# Patient Record
Sex: Male | Born: 1990 | Race: Black or African American | Hispanic: No | Marital: Single | Smoking: Never smoker
Health system: Southern US, Community
[De-identification: ages and names within clinical notes are randomized; demographics above are authoritative.]

## PROBLEM LIST (undated history)

## (undated) ENCOUNTER — Ambulatory Visit (HOSPITAL_COMMUNITY): Discharge status: Absent without leave | Payer: Commercial Managed Care - PPO

## (undated) DIAGNOSIS — E039 Hypothyroidism, unspecified: Secondary | ICD-10-CM

## (undated) DIAGNOSIS — R569 Unspecified convulsions: Secondary | ICD-10-CM

## (undated) DIAGNOSIS — E109 Type 1 diabetes mellitus without complications: Secondary | ICD-10-CM

## (undated) DIAGNOSIS — F840 Autistic disorder: Secondary | ICD-10-CM

## (undated) DIAGNOSIS — E041 Nontoxic single thyroid nodule: Secondary | ICD-10-CM

## (undated) HISTORY — DX: Type 1 diabetes mellitus without complications: E10.9

## (undated) HISTORY — DX: Nontoxic single thyroid nodule: E04.1

## (undated) HISTORY — DX: Hypothyroidism, unspecified: E03.9

## (undated) HISTORY — PX: NO PAST SURGERIES: SHX2092

## (undated) HISTORY — DX: Autistic disorder: F84.0

## (undated) HISTORY — DX: Unspecified convulsions: R56.9

---
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
## 2012-04-01 LAB — THYROID PANEL
TSH: 4.17
Thyroxine (T4): 3.9

## 2013-04-07 ENCOUNTER — Ambulatory Visit: Payer: Self-pay | Admitting: Family Medicine

## 2013-04-07 DIAGNOSIS — Z0289 Encounter for other administrative examinations: Secondary | ICD-10-CM

## 2013-07-27 ENCOUNTER — Ambulatory Visit (INDEPENDENT_AMBULATORY_CARE_PROVIDER_SITE_OTHER): Payer: Commercial Managed Care - PPO | Admitting: Family Medicine

## 2013-07-27 ENCOUNTER — Encounter: Payer: Self-pay | Admitting: Family Medicine

## 2013-07-27 ENCOUNTER — Encounter (INDEPENDENT_AMBULATORY_CARE_PROVIDER_SITE_OTHER): Payer: Self-pay

## 2013-07-27 VITALS — BP 122/78 | HR 64 | Temp 97.8°F | Ht 68.0 in | Wt 160.5 lb

## 2013-07-27 DIAGNOSIS — E109 Type 1 diabetes mellitus without complications: Secondary | ICD-10-CM | POA: Insufficient documentation

## 2013-07-27 DIAGNOSIS — E108 Type 1 diabetes mellitus with unspecified complications: Secondary | ICD-10-CM

## 2013-07-27 DIAGNOSIS — G40909 Epilepsy, unspecified, not intractable, without status epilepticus: Secondary | ICD-10-CM | POA: Insufficient documentation

## 2013-07-27 DIAGNOSIS — F840 Autistic disorder: Secondary | ICD-10-CM

## 2013-07-27 DIAGNOSIS — E039 Hypothyroidism, unspecified: Secondary | ICD-10-CM

## 2013-07-27 NOTE — Assessment & Plan Note (Signed)
Chronic, well controlled on keppra.

## 2013-07-27 NOTE — Assessment & Plan Note (Signed)
Chronic, seems he's followed by [facility] - will await records from them. Consider checking A1c next visit.

## 2013-07-27 NOTE — Progress Notes (Signed)
   BP 122/78  Pulse 64  Temp(Src) 97.8 F (36.6 C) (Oral)  Ht 5\' 8"  (1.727 m)  Wt 160 lb 8 oz (72.802 kg)  BMI 24.41 kg/m2   CC: new pt to establish  Subjective:    Patient ID: Francisco Colon, male    DOB: 10-26-1990, 23 y.o.   MRN: [id number]  HPI: Francisco Colon is a 23 y.o. male presenting on 07/27/2013 for Establish Care   23 yo with autism spectrum, seizure disorder controlled on keppra, hypothyroidism on levothyroxine, and T1DM presents to establish care today. Presents with uncle Cleophus MoltDavid Spruill (guardian)  [facility] - last seen 02/2013. Sees twice a year. Prior saw Dr. Glenis SmokerNeimeyer.  Finished at [employer] [facility] recently. Has seen [facility] for diabetic eye exam,   Seizure disorder - started 2 yrs ago. Controlled on keppra, Seizure free for last 2 yrs Hypothyroidism - on levothyroxine 50mcg daily for last few years. T1DM - dx age 89yo.  On novolog 70/30 - takes 35u in am and 20u at night.  Sees [facility] Q6 mo  Relevant past medical, surgical, family and social history reviewed and updated as indicated.  Allergies and medications reviewed and updated. No current outpatient prescriptions on file prior to visit.   No current facility-administered medications on file prior to visit.    Review of Systems Per HPI unless specifically indicated above    Objective:    BP 122/78  Pulse 64  Temp(Src) 97.8 F (36.6 C) (Oral)  Ht 5\' 8"  (1.727 m)  Wt 160 lb 8 oz (72.802 kg)  BMI 24.41 kg/m2  Physical Exam  Nursing note and vitals reviewed. Constitutional: He appears well-developed and well-nourished. No distress.  Pt agitated today, I was only able to do limited exam  HENT:  Head: Normocephalic and atraumatic.  Right Ear: Hearing, tympanic membrane, external ear and ear canal normal.  Left Ear: Hearing, tympanic membrane, external ear and ear canal normal.  Mouth/Throat: Oropharynx is clear and moist. No oropharyngeal exudate.  Eyes:  Conjunctivae and EOM are normal. Pupils are equal, round, and reactive to light. No scleral icterus.  Cardiovascular: Normal rate, regular rhythm, normal heart sounds and intact distal pulses.   No murmur heard. Pulmonary/Chest: Effort normal and breath sounds normal. No respiratory distress. He has no wheezes. He has no rales.  Psychiatric: His mood appears anxious. He is agitated and aggressive.   No results found for this or any previous visit.    Assessment & Plan:   Problem List Items Addressed This Visit   Type 1 diabetes - Primary     Chronic, seems he's followed by [facility] - will await records from them. Consider checking A1c next visit.    Relevant Medications      insulin aspart protamine- aspart (NOVOLOG MIX 70/30) (70-30) 100 UNIT/ML injection   Seizure disorder     Chronic, well controlled on keppra.    Hypothyroidism     Chronic, stable. Check records when they arrive. Consider TSH next labwork if not recently done.    Relevant Medications      levothyroxine (SYNTHROID, LEVOTHROID) 50 MCG tablet   Autism spectrum disorder     Will request records from prior PCP and [facility]        Follow up plan: Return in about 4 months (around 11/27/2013), or as needed, for physical.

## 2013-07-27 NOTE — Assessment & Plan Note (Signed)
Will request records from prior PCP and [facility]

## 2013-07-27 NOTE — Assessment & Plan Note (Signed)
Chronic, stable. Check records when they arrive. Consider TSH next labwork if not recently done.

## 2013-07-27 NOTE — Patient Instructions (Addendum)
Sign release of records from Dr. Glenis SmokerNeimeyer and [facility]. Bring me a copy of immunizations from health department. Good to meet you today, call us with quesitons. Return to see me in 3-4 months for follow up visit, may check blood work then.

## 2013-07-27 NOTE — Progress Notes (Signed)
Pre visit review using our clinic review tool, if applicable. No additional management support is needed unless otherwise documented below in the visit note. 

## 2013-07-31 ENCOUNTER — Other Ambulatory Visit: Payer: Self-pay

## 2013-07-31 MED ORDER — LEVOTHYROXINE SODIUM 50 MCG PO TABS: 50.0000 ug | ORAL_TABLET | Freq: Every day | ORAL | Status: DC

## 2013-07-31 MED ORDER — LEVETIRACETAM 500 MG PO TABS: 500.0000 mg | ORAL_TABLET | Freq: Two times a day (BID) | ORAL | Status: DC

## 2013-07-31 NOTE — Telephone Encounter (Signed)
Refilled

## 2013-07-31 NOTE — Telephone Encounter (Signed)
Pt left note requesting refill on Keppra and Synthroid to [facility]; is it OK to refill?

## 2013-08-08 ENCOUNTER — Encounter: Payer: Self-pay | Admitting: Family Medicine

## 2013-08-11 ENCOUNTER — Encounter: Payer: Self-pay | Admitting: *Deleted

## 2013-10-25 ENCOUNTER — Encounter: Payer: Self-pay | Admitting: Family Medicine

## 2013-11-16 ENCOUNTER — Encounter: Payer: Self-pay | Admitting: Family Medicine

## 2013-11-16 ENCOUNTER — Ambulatory Visit (INDEPENDENT_AMBULATORY_CARE_PROVIDER_SITE_OTHER): Payer: Commercial Managed Care - PPO | Admitting: Family Medicine

## 2013-11-16 VITALS — BP 118/78 | HR 68 | Temp 97.7°F | Wt 163.2 lb

## 2013-11-16 DIAGNOSIS — F840 Autistic disorder: Secondary | ICD-10-CM

## 2013-11-16 DIAGNOSIS — E039 Hypothyroidism, unspecified: Secondary | ICD-10-CM | POA: Diagnosis not present

## 2013-11-16 DIAGNOSIS — G40909 Epilepsy, unspecified, not intractable, without status epilepticus: Secondary | ICD-10-CM

## 2013-11-16 DIAGNOSIS — E108 Type 1 diabetes mellitus with unspecified complications: Secondary | ICD-10-CM

## 2013-11-16 LAB — COMPREHENSIVE METABOLIC PANEL
ALBUMIN: 3.7 g/dL (ref 3.5–5.2)
ALT: 15 U/L (ref 0–53)
AST: 16 U/L (ref 0–37)
Alkaline Phosphatase: 45 U/L (ref 39–117)
BUN: 12 mg/dL (ref 6–23)
CALCIUM: 9.4 mg/dL (ref 8.4–10.5)
CHLORIDE: 101 meq/L (ref 96–112)
CO2: 37 mEq/L — ABNORMAL HIGH (ref 19–32)
Creatinine, Ser: 1 mg/dL (ref 0.4–1.5)
GFR: 118.04 mL/min (ref >60.00)
Glucose, Bld: 67 mg/dL — ABNORMAL LOW (ref 70–99)
Potassium: 3.5 mEq/L (ref 3.5–5.1)
Sodium: 140 mEq/L (ref 135–145)
Total Bilirubin: 1.8 mg/dL — ABNORMAL HIGH (ref 0.2–1.2)
Total Protein: 7.4 g/dL (ref 6.0–8.3)

## 2013-11-16 LAB — LIPID PANEL
CHOLESTEROL: 130 mg/dL (ref 0–200)
HDL: 50.9 mg/dL (ref >39.00)
LDL Cholesterol: 66 mg/dL (ref 0–99)
NONHDL: 79.1
Total CHOL/HDL Ratio: 3
Triglycerides: 67 mg/dL (ref 0.0–149.0)
VLDL: 13.4 mg/dL (ref 0.0–40.0)

## 2013-11-16 LAB — TSH: TSH: 3.14 u[IU]/mL (ref 0.35–4.50)

## 2013-11-16 LAB — HEMOGLOBIN A1C: Hgb A1c MFr Bld: 8.7 % — ABNORMAL HIGH (ref 4.6–6.5)

## 2013-11-16 MED ORDER — LEVOTHYROXINE SODIUM 50 MCG PO TABS: 50.0000 ug | ORAL_TABLET | Freq: Every day | ORAL | Status: DC

## 2013-11-16 MED ORDER — LEVETIRACETAM 500 MG PO TABS: 500.0000 mg | ORAL_TABLET | Freq: Two times a day (BID) | ORAL | Status: DC

## 2013-11-16 MED ORDER — INSULIN PEN NEEDLE 31G X 5 MM MISC: Status: DC

## 2013-11-16 MED ORDER — INSULIN ASPART PROT & ASPART (70-30 MIX) 100 UNIT/ML ~~LOC~~ SUSP: SUBCUTANEOUS | Status: DC

## 2013-11-16 NOTE — Assessment & Plan Note (Signed)
Chronic, check TSH today and titrate med accordingly.

## 2013-11-16 NOTE — Progress Notes (Signed)
   BP 118/78  Pulse 68  Temp(Src) 97.7 F (36.5 C) (Oral)  Wt 163 lb 4 oz (74.05 kg)   CC: f/uvisit  Subjective:    Patient ID: Francisco Colon, male    DOB: 1990/03/14, 23 y.o.   MRN: [id number]  HPI: Francisco Colon is a 23 y.o. male presenting on 11/16/2013 for Follow-up   See prior note for details. Established with our office 3 mo ago. Presents with caregiver and uncle Cleophus MoltDavid Spruill (guardian). Stays with grandmother. T1DM, autistic, seizure disorder along with hypothyroid history. [facility] ([facility], neuro) - Q6 months.   DM - novolog 70/30 35u am and 20u pm. Does check sugars regularly. Last was a few days ago - 190 after meal. Did not bring log.  No results found for this basename: HGBA1C   [facility].   Declines flu shot today.  Relevant past medical, surgical, family and social history reviewed and updated as indicated.  Allergies and medications reviewed and updated. No current outpatient prescriptions on file prior to visit.   No current facility-administered medications on file prior to visit.    Review of Systems Per HPI unless specifically indicated above    Objective:    BP 118/78  Pulse 68  Temp(Src) 97.7 F (36.5 C) (Oral)  Wt 163 lb 4 oz (74.05 kg)  Physical Exam  Nursing note and vitals reviewed. Constitutional: He appears well-developed and well-nourished. No distress.  Easily gets restless  HENT:  Head: Normocephalic and atraumatic.  Mouth/Throat: Oropharynx is clear and moist. No oropharyngeal exudate.  Eyes: Conjunctivae and EOM are normal. Pupils are equal, round, and reactive to light. No scleral icterus.  Neck: Normal range of motion. Neck supple. No thyromegaly present.  Cardiovascular: Normal rate, regular rhythm, normal heart sounds and intact distal pulses.   No murmur heard. Pulmonary/Chest: Effort normal and breath sounds normal. No respiratory distress. He has  no wheezes. He has no rales.  Musculoskeletal: He exhibits no edema.  Lymphadenopathy:    He has no cervical adenopathy.       Assessment & Plan:   Problem List Items Addressed This Visit   Type 1 diabetes - Primary     Have not received records - will fill out another ROI today from [facility] Check A1c today. Did not do foot exam today as pt was becoming restless in office - check next visit if not done by endo.    Relevant Medications      insulin aspart protamine- aspart (NOVOLOG MIX 70/30) (70-30) 100 UNIT/ML injection   Other Relevant Orders      Lipid panel      Hemoglobin A1c      Comprehensive metabolic panel   Seizure disorder     Chronic, refilled keppra    Hypothyroidism     Chronic, check TSH today and titrate med accordingly.    Relevant Medications      levothyroxine (SYNTHROID, LEVOTHROID) tablet   Other Relevant Orders      TSH   Autism spectrum disorder     Stable period.        Follow up plan: Return as needed, for follow up visit.

## 2013-11-16 NOTE — Progress Notes (Signed)
Pre visit review using our clinic review tool, if applicable. No additional management support is needed unless otherwise documented below in the visit note. 

## 2013-11-16 NOTE — Patient Instructions (Addendum)
Blood work today. Good to see you.  Return as needed or in 3-4 months for follow up Sign release [facility]

## 2013-11-16 NOTE — Assessment & Plan Note (Signed)
Have not received records - will fill out another ROI today from [facility] Check A1c today. Did not do foot exam today as pt was becoming restless in office - check next visit if not done by endo.

## 2013-11-16 NOTE — Assessment & Plan Note (Signed)
Stable period.  

## 2013-11-16 NOTE — Assessment & Plan Note (Signed)
Chronic, refilled keppra

## 2013-11-19 ENCOUNTER — Encounter: Payer: Self-pay | Admitting: *Deleted

## 2014-01-09 ENCOUNTER — Encounter: Payer: Self-pay | Admitting: Family Medicine

## 2014-02-16 ENCOUNTER — Ambulatory Visit: Payer: Commercial Managed Care - PPO | Admitting: Family Medicine

## 2014-02-23 ENCOUNTER — Ambulatory Visit (INDEPENDENT_AMBULATORY_CARE_PROVIDER_SITE_OTHER): Payer: Commercial Managed Care - PPO | Admitting: Family Medicine

## 2014-02-23 ENCOUNTER — Encounter: Payer: Self-pay | Admitting: Family Medicine

## 2014-02-23 VITALS — BP 122/78 | HR 68 | Temp 98.2°F | Wt 157.5 lb

## 2014-02-23 DIAGNOSIS — G40909 Epilepsy, unspecified, not intractable, without status epilepticus: Secondary | ICD-10-CM | POA: Diagnosis not present

## 2014-02-23 DIAGNOSIS — F840 Autistic disorder: Secondary | ICD-10-CM

## 2014-02-23 DIAGNOSIS — Z23 Encounter for immunization: Secondary | ICD-10-CM

## 2014-02-23 DIAGNOSIS — E039 Hypothyroidism, unspecified: Secondary | ICD-10-CM

## 2014-02-23 DIAGNOSIS — E108 Type 1 diabetes mellitus with unspecified complications: Secondary | ICD-10-CM | POA: Diagnosis not present

## 2014-02-23 LAB — HEMOGLOBIN A1C: Hgb A1c MFr Bld: 9.1 % — ABNORMAL HIGH (ref 4.6–6.5)

## 2014-02-23 NOTE — Assessment & Plan Note (Signed)
Chronic, stable. Continue keppra.

## 2014-02-23 NOTE — Progress Notes (Signed)
Pre visit review using our clinic review tool, if applicable. No additional management support is needed unless otherwise documented below in the visit note. 

## 2014-02-23 NOTE — Assessment & Plan Note (Signed)
Chronic, stable. Continue current levothyroxine dose 50mcg daily. Lab Results  Component Value Date   TSH 3.14 11/16/2013

## 2014-02-23 NOTE — Progress Notes (Signed)
BP 122/78 mmHg  Pulse 68  Temp(Src) 98.2 F (36.8 C) (Oral)  Wt 157 lb 8 oz (71.442 kg)   CC: f/u visit  Subjective:    Patient ID: Francisco Colon, male    DOB: 1991-01-15, 24 y.o.   MRN: [id number]  HPI: Francisco Colon is a 24 y.o. male presenting on 02/23/2014 for Follow-up   Presents with caregiver Francisco, Francisco Hua. Body mass index is 23.95 kg/(m^2).   Compliant with meds - keppra  bid, levothyroxine daily, and novolog 70/30 35u am and 20u pm. Francisco endorses grandma checks sugars twice daily, endorses running well controlled at home "150s". T1DM followed by [facility].   Lab Results  Component Value Date   HGBA1C 8.7* 11/16/2013   No seizures since 06/07/10.   Lives with grandmother Francisco Dandy Colon) and brother Francisco Colon) Caregiver is Francisco Francisco Colon) Mother died from cancer around 06-07-2007.  Edu: 12th grade, consider [employer]  Occ: nothing currently  Activity: no regular exercise  Diet: good water, fruits/vegetables daily  Relevant past medical, surgical, family and social history reviewed and updated as indicated. Interim medical history since our last visit reviewed. Allergies and medications reviewed and updated. Current Outpatient Prescriptions on File Prior to Visit  Medication Sig  . insulin aspart protamine- aspart (NOVOLOG MIX 70/30) (70-30) 100 UNIT/ML injection Inject 35 Units every morning and 20 Units every evening . Qs 1 month  . Insulin Pen Needle 31G X 5 MM MISC Use as directed twice daily for novolog 70/30.  Marland Kitchen levETIRAcetam (KEPPRA) 500 MG tablet Take 1 tablet (500 mg total) by mouth 2 (two) times daily.  Marland Kitchen levothyroxine (SYNTHROID, LEVOTHROID) 50 MCG tablet Take 1 tablet (50 mcg total) by mouth daily before breakfast.   No current facility-administered medications on file prior to visit.   Past Medical History  Diagnosis Date  . Type 1 diabetes 2000  . Hypothyroidism   . Seizure 06/07/2010    hypoglycemia induced - s/p eval by neuro  . Autism spectrum  disorder   . Thyroid nodule     by prior US    Past Surgical History  Procedure Laterality Date  . No past surgeries      History  Substance Use Topics  . Smoking status: Never Smoker   . Smokeless tobacco: Never Used  . Alcohol Use: No     Review of Systems Per HPI unless specifically indicated above     Objective:    BP 122/78 mmHg  Pulse 68  Temp(Src) 98.2 F (36.8 C) (Oral)  Wt 157 lb 8 oz (71.442 kg)  Wt Readings from Last 3 Encounters:  02/23/14 157 lb 8 oz (71.442 kg)  11/16/13 163 lb 4 oz (74.05 kg)  07/27/13 160 lb 8 oz (72.802 kg)    Physical Exam  Constitutional: He appears well-developed and well-nourished. No distress.  HENT:  Head: Normocephalic and atraumatic.  Right Ear: External ear normal.  Left Ear: External ear normal.  Nose: Nose normal.  Mouth/Throat: Oropharynx is clear and moist. No oropharyngeal exudate.  Eyes: Conjunctivae and EOM are normal. Pupils are equal, round, and reactive to light. No scleral icterus.  Neck: Normal range of motion. Neck supple.  Cardiovascular: Normal rate, regular rhythm, normal heart sounds and intact distal pulses.   No murmur heard. Pulmonary/Chest: Effort normal and breath sounds normal. No respiratory distress. He has no wheezes. He has no rales.  Musculoskeletal: He exhibits no edema.  See HPI for foot exam if done  Lymphadenopathy:    He has no cervical adenopathy.  Skin: Skin is warm and dry. No rash noted.  Psychiatric: He has a normal mood and affect.  Nursing note and vitals reviewed.  Results for orders placed or performed in visit on 11/16/13  Lipid panel  Result Value Ref Range   Cholesterol 130 0 - 200 mg/dL   Triglycerides 14.767.0 0.0 - 149.0 mg/dL   HDL 82.9550.90 >62.13>39.00 mg/dL   VLDL 08.613.4 0.0 - 57.840.0 mg/dL   LDL Cholesterol 66 0 - 99 mg/dL   Total CHOL/HDL Ratio 3    NonHDL 79.10   Hemoglobin A1c  Result Value Ref Range   Hgb A1c MFr Bld 8.7 (H) 4.6 - 6.5 %  Comprehensive metabolic panel    Result Value Ref Range   Sodium 140 135 - 145 mEq/L   Potassium 3.5 3.5 - 5.1 mEq/L   Chloride 101 96 - 112 mEq/L   CO2 37 (H) 19 - 32 mEq/L   Glucose, Bld 67 (L) 70 - 99 mg/dL   BUN 12 6 - 23 mg/dL   Creatinine, Ser 1.0 0.4 - 1.5 mg/dL   Total Bilirubin 1.8 (H) 0.2 - 1.2 mg/dL   Alkaline Phosphatase 45 39 - 117 U/L   AST 16 0 - 37 U/L   ALT 15 0 - 53 U/L   Total Protein 7.4 6.0 - 8.3 g/dL   Albumin 3.7 3.5 - 5.2 g/dL   Calcium 9.4 8.4 - 46.910.5 mg/dL   GFR 629.52118.04 >84.13>60.00 mL/min  TSH  Result Value Ref Range   TSH 3.14 0.35 - 4.50 uIU/mL      Assessment & Plan:   Problem List Items Addressed This Visit    Type 1 diabetes - Primary    Records received. Francisco Colon to schedule f/u appt with [facility] as he says he's due. Will [facility] on [facility] not may receive here or there. Flu shot today. Check A1c today. Referral to [facility] placed today for DM eye exam.      Relevant Orders   Ambulatory referral to Ophthalmology   Hemoglobin A1c   Seizure disorder    Chronic, stable. Continue keppra.      Hypothyroidism (acquired)    Chronic, stable. Continue current levothyroxine dose 50mcg daily. Lab Results  Component Value Date   TSH 3.14 11/16/2013        Autism spectrum disorder    Has caused difficulty in providing medical care in past, today a bit agitated with flu shot but overall cooperative.          Follow up plan: Return in about 6 months (around 08/24/2014), or as needed, for follow up visit.

## 2014-02-23 NOTE — Assessment & Plan Note (Addendum)
Has caused difficulty in providing medical care in past, today a bit agitated with flu shot but overall cooperative.

## 2014-02-23 NOTE — Addendum Note (Signed)
Addended by: Baldomero LamyHAVERS, NATASHA C on: 02/23/2014 01:59 PM   Modules accepted: Orders

## 2014-02-23 NOTE — Assessment & Plan Note (Addendum)
Records received. Mena GoesUncle [name] to schedule f/u appt with [facility] as he says he's due. Will [facility] not may receive here or there. Flu shot today. Check A1c today. Referral to  eye care placed today for DM eye exam.

## 2014-02-23 NOTE — Patient Instructions (Addendum)
Good to see you today. Blood work today. Flu shot today. We will refer you to eye doctor. Return as needed or in 4-6 months for follow up. [facility]. Check if he's had pneumonia shot.

## 2014-02-24 ENCOUNTER — Encounter: Payer: Self-pay | Admitting: *Deleted

## 2014-03-05 LAB — HM DIABETES EYE EXAM

## 2014-03-12 ENCOUNTER — Encounter: Payer: Self-pay | Admitting: Family Medicine

## 2014-03-12 ENCOUNTER — Telehealth: Payer: Self-pay | Admitting: Family Medicine

## 2014-03-12 DIAGNOSIS — L602 Onychogryphosis: Secondary | ICD-10-CM

## 2014-03-12 DIAGNOSIS — F840 Autistic disorder: Secondary | ICD-10-CM

## 2014-03-12 DIAGNOSIS — E108 Type 1 diabetes mellitus with unspecified complications: Secondary | ICD-10-CM

## 2014-03-12 NOTE — Telephone Encounter (Signed)
Francisco HuaDavid called wanting to get a referral to [facility] He needs them to cut Elster toenails

## 2014-03-12 NOTE — Telephone Encounter (Signed)
referral placed

## 2014-03-16 NOTE — Telephone Encounter (Signed)
Appt made and Uncle notified.

## 2014-03-23 ENCOUNTER — Ambulatory Visit (INDEPENDENT_AMBULATORY_CARE_PROVIDER_SITE_OTHER): Payer: Commercial Managed Care - PPO | Admitting: Podiatry

## 2014-03-23 VITALS — BP 109/65 | HR 65 | Resp 16 | Ht 68.0 in | Wt 130.0 lb

## 2014-03-23 DIAGNOSIS — B351 Tinea unguium: Secondary | ICD-10-CM | POA: Diagnosis not present

## 2014-03-23 DIAGNOSIS — M79676 Pain in unspecified toe(s): Secondary | ICD-10-CM

## 2014-03-23 NOTE — Progress Notes (Signed)
   Subjective:    Patient ID: Francisco Colon, male    DOB: Jul 24, 1990, 24 y.o.   MRN: [id number]  HPI Comments: 24 year old autistic type I diabetic male presents the office today with his father. He states that patient tries to cut his nails himself however last time he did he cut his toe. According to the father the patient states he complains about the nails when they become elongated. Denies any redness or drainage along the nail sites. No other complaints at this time.  Diabetes      Review of Systems  Endocrine:       Diabetes  All other systems reviewed and are negative.      Objective:   Physical Exam Awake, alert, NAD DP/PT pulses palpable, CRT less than 3 seconds Protective sensation difficult to evaluate but it appears to be intact, Achilles tendon reflex intact Nails are dystrophic, discolored, elongated, brittle 10. There appears to be some subjective tenderness upon palpation of the nails 1-5 bilaterally. There is no swelling erythema or drainage on the nail sites. No open lesions or pre-ulcerative lesions. No interdigital maceration. There is not appear to be any other areas of tenderness to bilateral lower extremities. No overlying edema, erythema, increase in warmth. No pain with calf compression, swelling, warmth, erythema.      Assessment & Plan:  24 year old male with symptomatic onychomycosis -Treatment options discussed with the patient's father including alternatives, risks, complications -Nail sharply debrided 10. On the left hallux there was a small amount of bleeding from with a skin over grew the nail. The area was cleaned and antibiotic ointment and a Band-Aid was applied. Discussed the patient's father to her closely monitor the area. If the area has not healed within 1 week or there is any problems to call the office for follow-up appointment. -Discussed the importance of the foot inspection -Follow-up in 3 months or sooner if any problems are to  arise. In the meantime, encouraged to call the office for any questions, concerns, change in symptoms.

## 2014-03-23 NOTE — Patient Instructions (Signed)
Diabetes and Foot Care Diabetes may cause you to have problems because of poor blood supply (circulation) to your feet and legs. This may cause the skin on your feet to become thinner, break easier, and heal more slowly. Your skin may become dry, and the skin may peel and crack. You may also have nerve damage in your legs and feet causing decreased feeling in them. You may not notice minor injuries to your feet that could lead to infections or more serious problems. Taking care of your feet is one of the most important things you can do for yourself.  HOME CARE INSTRUCTIONS  Wear shoes at all times, even in the house. Do not go barefoot. Bare feet are easily injured.  Check your feet daily for blisters, cuts, and redness. If you cannot see the bottom of your feet, use a mirror or ask someone for help.  Wash your feet with warm water (do not use hot water) and mild soap. Then pat your feet and the areas between your toes until they are completely dry. Do not soak your feet as this can dry your skin.  Apply a moisturizing lotion or petroleum jelly (that does not contain alcohol and is unscented) to the skin on your feet and to dry, brittle toenails. Do not apply lotion between your toes.  Trim your toenails straight across. Do not dig under them or around the cuticle. File the edges of your nails with an emery board or nail file.  Do not cut corns or calluses or try to remove them with medicine.  Wear clean socks or stockings every day. Make sure they are not too tight. Do not wear knee-high stockings since they may decrease blood flow to your legs.  Wear shoes that fit properly and have enough cushioning. To break in new shoes, wear them for just a few hours a day. This prevents you from injuring your feet. Always look in your shoes before you put them on to be sure there are no objects inside.  Do not cross your legs. This may decrease the blood flow to your feet.  If you find a minor scrape,  cut, or break in the skin on your feet, keep it and the skin around it clean and dry. These areas may be cleansed with mild soap and water. Do not cleanse the area with peroxide, alcohol, or iodine.  When you remove an adhesive bandage, be sure not to damage the skin around it.  If you have a wound, look at it several times a day to make sure it is healing.  Do not use heating pads or hot water bottles. They may burn your skin. If you have lost feeling in your feet or legs, you may not know it is happening until it is too late.  Make sure your health care provider performs a complete foot exam at least annually or more often if you have foot problems. Report any cuts, sores, or bruises to your health care provider immediately. SEEK MEDICAL CARE IF:   You have an injury that is not healing.  You have cuts or breaks in the skin.  You have an ingrown nail.  You notice redness on your legs or feet.  You feel burning or tingling in your legs or feet.  You have pain or cramps in your legs and feet.  Your legs or feet are numb.  Your feet always feel cold. SEEK IMMEDIATE MEDICAL CARE IF:   There is increasing redness,   swelling, or pain in or around a wound.  There is a red line that goes up your leg.  Pus is coming from a wound.  You develop a fever or as directed by your health care provider.  You notice a bad smell coming from an ulcer or wound. Document Released: 01/06/2000 Document Revised: 09/10/2012 Document Reviewed: 06/17/2012 ExitCare Patient Information 2015 [employer]. This information is not intended to replace advice given to you by your health care provider. Make sure you discuss any questions you have with your health care provider.  

## 2014-06-29 ENCOUNTER — Ambulatory Visit: Payer: Commercial Managed Care - PPO

## 2014-07-13 ENCOUNTER — Ambulatory Visit (INDEPENDENT_AMBULATORY_CARE_PROVIDER_SITE_OTHER): Payer: Commercial Managed Care - PPO | Admitting: Podiatry

## 2014-07-13 DIAGNOSIS — B351 Tinea unguium: Secondary | ICD-10-CM | POA: Diagnosis not present

## 2014-07-13 DIAGNOSIS — M79674 Pain in right toe(s): Secondary | ICD-10-CM | POA: Diagnosis not present

## 2014-07-13 DIAGNOSIS — M79675 Pain in left toe(s): Secondary | ICD-10-CM

## 2014-07-13 DIAGNOSIS — M79676 Pain in unspecified toe(s): Secondary | ICD-10-CM

## 2014-07-13 NOTE — Progress Notes (Signed)
°  Subjective: 24 y.o.-year-old male returns the office today for painful, elongated, thickened toenails which he is unable to trim himself. Denies any redness or drainage around the nails. Denies any acute changes since last appointment and no new complaints today. Denies any systemic complaints such as fevers, chills, nausea, vomiting.   Objective: Awake, alert, NAD; presents today with his father DP/PT pulses palpable, CRT less than 3 seconds Protective sensation intact with Dorann Ou monofilament Nails hypertrophic, dystrophic, elongated, brittle, discolored 10. There is tenderness overlying the nails 1-5 bilaterally. There is no surrounding erythema or drainage along the nail sites. No open lesions or pre-ulcerative lesions are identified. No other areas of tenderness bilateral lower extremities. No overlying edema, erythema, increased warmth. No pain with calf compression, swelling, warmth, erythema.  Assessment: Patient presents with symptomatic onychomycosis  Plan: -Treatment options including alternatives, risks, complications were discussed -Nails sharply debrided 10 without complication/bleeding. -Discussed daily foot inspection. If there are any changes, to call the office immediately.  -Follow-up in 3 months or sooner if any problems are to arise. In the meantime, encouraged to call the office with any questions, concerns, changes symptoms.

## 2014-08-05 ENCOUNTER — Ambulatory Visit: Payer: Commercial Managed Care - PPO | Admitting: Family Medicine

## 2014-10-14 ENCOUNTER — Ambulatory Visit (INDEPENDENT_AMBULATORY_CARE_PROVIDER_SITE_OTHER): Payer: Commercial Managed Care - PPO | Admitting: Podiatry

## 2014-10-14 DIAGNOSIS — B351 Tinea unguium: Secondary | ICD-10-CM | POA: Diagnosis not present

## 2014-10-14 DIAGNOSIS — M79676 Pain in unspecified toe(s): Secondary | ICD-10-CM

## 2014-10-14 NOTE — Progress Notes (Signed)
°  Subjective: 24 y.o.-year-old male returns the office today for painful, elongated, thickened toenails which he is unable to trim himself. Denies any redness or drainage around the nails. Denies any acute changes since last appointment and no new complaints today. Denies any systemic complaints such as fevers, chills, nausea, vomiting.   Objective: Awake, alert, NAD; presents today with his father DP/PT pulses palpable, CRT less than 3 seconds Protective sensation intact with Dorann Ou monofilament Nails hypertrophic, dystrophic, elongated, brittle, discolored 10. There is tenderness overlying the nails 1-5 bilaterally. There is no surrounding erythema or drainage along the nail sites. No open lesions or pre-ulcerative lesions are identified. No other areas of tenderness bilateral lower extremities. No overlying edema, erythema, increased warmth. No pain with calf compression, swelling, warmth, erythema.  Assessment: Patient presents with symptomatic onychomycosis  Plan: -Treatment options including alternatives, risks, complications were discussed -Nails sharply debrided 10 without complication/bleeding. -Discussed daily foot inspection. If there are any changes, to call the office immediately.  -Follow-up in 3 months or sooner if any problems are to arise. In the meantime, encouraged to call the office with any questions, concerns, changes symptoms.  Ovid Curd, DPM

## 2014-11-22 ENCOUNTER — Other Ambulatory Visit: Payer: Self-pay | Admitting: Family Medicine

## 2014-11-25 ENCOUNTER — Other Ambulatory Visit: Payer: Self-pay | Admitting: *Deleted

## 2015-01-13 ENCOUNTER — Ambulatory Visit: Payer: Commercial Managed Care - PPO | Admitting: Podiatry

## 2015-01-13 ENCOUNTER — Ambulatory Visit: Payer: Commercial Managed Care - PPO

## 2015-02-24 ENCOUNTER — Ambulatory Visit: Payer: Commercial Managed Care - PPO | Admitting: Podiatry

## 2015-03-04 ENCOUNTER — Ambulatory Visit: Payer: Medicaid Other | Admitting: Sports Medicine

## 2015-06-03 ENCOUNTER — Encounter: Payer: Self-pay | Admitting: Sports Medicine

## 2015-06-03 ENCOUNTER — Ambulatory Visit (INDEPENDENT_AMBULATORY_CARE_PROVIDER_SITE_OTHER): Payer: Medicaid Other | Admitting: Sports Medicine

## 2015-06-03 DIAGNOSIS — B351 Tinea unguium: Secondary | ICD-10-CM | POA: Diagnosis not present

## 2015-06-03 DIAGNOSIS — E119 Type 2 diabetes mellitus without complications: Secondary | ICD-10-CM

## 2015-06-03 DIAGNOSIS — M79676 Pain in unspecified toe(s): Secondary | ICD-10-CM | POA: Diagnosis not present

## 2015-06-03 DIAGNOSIS — M79609 Pain in unspecified limb: Principal | ICD-10-CM

## 2015-06-03 NOTE — Progress Notes (Signed)
Patient ID: Francisco BeckersJamal N Everard, male   DOB: 1990-02-02, 25 y.o.   MRN: [id number] Subjective: Francisco Colon is a 25 y.o. male patient with history of diabetes who presents to office today complaining of long, painful nails  while ambulating in shoes; unable to trim. Patient states that the glucose reading this morning was 140 mg/dl. Patient is assisted by dad who reports this. Patient denies any new changes in medication or new problems. Patient denies any new cramping, numbness, burning or tingling in the legs.  Patient Active Problem List   Diagnosis Date Noted  . Type 1 diabetes (HCC)   . Hypothyroidism (acquired)   . Seizure disorder (HCC)   . Autism spectrum disorder   . Type 1 diabetes mellitus (HCC) 07/27/2011  . Active autistic disorder 04/27/2011  . Acquired hypothyroidism 04/27/2011  . Seizure (HCC) 04/27/2011  . Sickle cell trait (HCC) 04/27/2011   Current Outpatient Prescriptions on File Prior to Visit  Medication Sig Dispense Refill  . insulin aspart protamine- aspart (NOVOLOG MIX 70/30) (70-30) 100 UNIT/ML injection Inject 35 Units every morning and 20 Units every evening . Qs 1 month 10 mL 11  . Insulin Pen Needle 31G X 5 MM MISC Use as directed twice daily for novolog 70/30. 100 each 11  . levETIRAcetam (KEPPRA) 500 MG tablet TAKE 1 TABLET TWICE A DAY 60 tablet 6  . levothyroxine (SYNTHROID, LEVOTHROID) 50 MCG tablet Take 1 tablet (50 mcg total) by mouth daily before breakfast. 30 tablet 11   No current facility-administered medications on file prior to visit.   No Known Allergies  No results found for this or any previous visit (from the past 2160 hour(s)).  Objective: General: Patient is awake, alert, and oriented x 3 and in no acute distress.  Integument: Skin is warm, dry and supple bilateral. Nails are tender, long, thickened and  dystrophic with subungual debris, consistent with onychomycosis, 1-5 bilateral. No signs of infection. No open lesions or preulcerative  lesions present bilateral. Remaining integument unremarkable.  Vasculature:  Dorsalis Pedis pulse 2/4 bilateral. Posterior Tibial pulse  2/4 bilateral.  Capillary fill time <3 sec 1-5 bilateral. Positive hair growth to the level of the digits. Temperature gradient within normal limits. No varicosities present bilateral. No edema present bilateral.   Neurology: The patient has intact sensation measured with a 5.07/10g Semmes Weinstein Monofilament at all pedal sites bilateral . Vibratory sensation intact bilateral with tuning fork. No Babinski sign present bilateral.   Musculoskeletal: No symptomatic pedal deformities noted bilateral. Muscular strength 5/5 in all lower extremity muscular groups bilateral without pain on range of motion . No tenderness with calf compression bilateral.  Assessment and Plan: Problem List Items Addressed This Visit    None    Visit Diagnoses    Pain due to onychomycosis of nail    -  Primary    Diabetes mellitus without complication (HCC)           -Examined patient. -Discussed and educated patient on diabetic foot care, especially with  regards to the vascular, neurological and musculoskeletal systems.  -Stressed the importance of good glycemic control and the detriment of not  controlling glucose levels in relation to the foot. -Mechanically debrided all nails 1-5 bilateral using sterile nail nipper and filed with dremel without incident  -Answered all patient questions -Patient to return  in 3 months for at risk foot care -Patient advised to call the office if any problems or questions arise in the meantime.  Kayle Passarelli  Cannon Kettle, DPM

## 2015-09-02 ENCOUNTER — Encounter: Payer: Self-pay | Admitting: Sports Medicine

## 2015-09-02 ENCOUNTER — Ambulatory Visit (INDEPENDENT_AMBULATORY_CARE_PROVIDER_SITE_OTHER): Payer: Medicaid Other | Admitting: Sports Medicine

## 2015-09-02 DIAGNOSIS — M79609 Pain in unspecified limb: Principal | ICD-10-CM

## 2015-09-02 DIAGNOSIS — M79676 Pain in unspecified toe(s): Secondary | ICD-10-CM | POA: Diagnosis not present

## 2015-09-02 DIAGNOSIS — B351 Tinea unguium: Secondary | ICD-10-CM | POA: Diagnosis not present

## 2015-09-02 DIAGNOSIS — E119 Type 2 diabetes mellitus without complications: Secondary | ICD-10-CM

## 2015-09-02 NOTE — Progress Notes (Signed)
Patient ID: Francisco Colon, male   DOB: 02-Sep-1990, 25 y.o.   MRN: [id number] Subjective: Francisco Colon is a 25 y.o. male patient with history of diabetes who returns to office today complaining of long, painful nails while ambulating in shoes; unable to trim. Patient is assisted by dad who states that the glucose reading this morning was "good". Patient has a new PCP. Patient denies any new changes in medication. Had 1 episode of bleeding at right hallux nail but soaked with peroxide with resolution. Patient denies any new cramping, numbness, burning or tingling in the legs.  Patient Active Problem List   Diagnosis Date Noted  . Type 1 diabetes (HCC)   . Hypothyroidism (acquired)   . Seizure disorder (HCC)   . Autism spectrum disorder   . Type 1 diabetes mellitus (HCC) 07/27/2011  . Active autistic disorder 04/27/2011  . Acquired hypothyroidism 04/27/2011  . Seizure (HCC) 04/27/2011  . Sickle cell trait (HCC) 04/27/2011   Current Outpatient Prescriptions on File Prior to Visit  Medication Sig Dispense Refill  . insulin aspart protamine- aspart (NOVOLOG MIX 70/30) (70-30) 100 UNIT/ML injection Inject 35 Units every morning and 20 Units every evening . Qs 1 month 10 mL 11  . Insulin Pen Needle 31G X 5 MM MISC Use as directed twice daily for novolog 70/30. 100 each 11  . levETIRAcetam (KEPPRA) 500 MG tablet TAKE 1 TABLET TWICE A DAY 60 tablet 6  . levothyroxine (SYNTHROID, LEVOTHROID) 50 MCG tablet Take 1 tablet (50 mcg total) by mouth daily before breakfast. 30 tablet 11   No current facility-administered medications on file prior to visit.    No Known Allergies  No results found for this or any previous visit (from the past 2160 hour(s)).  Objective: General: Patient is awake, alert, and oriented x 3 and in no acute distress.  Integument: Skin is warm, dry and supple bilateral. Nails are tender, long, thickened and  dystrophic with subungual debris, consistent with onychomycosis,  1-5 bilateral. No signs of infection. No open lesions or preulcerative lesions present bilateral. Remaining integument unremarkable.  Vasculature:  Dorsalis Pedis pulse 2/4 bilateral. Posterior Tibial pulse  2/4 bilateral.  Capillary fill time <3 sec 1-5 bilateral. Positive hair growth to the level of the digits. Temperature gradient within normal limits. No varicosities present bilateral. No edema present bilateral.   Neurology: The patient has intact sensation measured with a 5.07/10g Semmes Weinstein Monofilament at all pedal sites bilateral . Vibratory sensation intact bilateral with tuning fork. No Babinski sign present bilateral.   Musculoskeletal: No symptomatic pedal deformities noted bilateral. Muscular strength 5/5 in all lower extremity muscular groups bilateral without pain on range of motion . No tenderness with calf compression bilateral.  Assessment and Plan: Problem List Items Addressed This Visit    None    Visit Diagnoses    Pain due to onychomycosis of nail    -  Primary   Diabetes mellitus without complication (HCC)          -Examined patient. -Discussed and educated patient on diabetic foot care, especially with regards to the vascular, neurological and musculoskeletal systems.  -Stressed the importance of good glycemic control and the detriment of not controlling glucose levels in relation to the foot. -Mechanically debrided all nails 1-5 bilateral using sterile nail nipper and filed with dremel without incident  -Answered all patient and dad's questions -Patient to return  in 3 months for at risk foot care -Patient advised to call the office  if any problems or questions arise in the meantime.  Asencion Islamitorya Nastasha Reising, DPM

## 2015-12-09 ENCOUNTER — Encounter: Payer: Self-pay | Admitting: Podiatry

## 2015-12-09 ENCOUNTER — Ambulatory Visit (INDEPENDENT_AMBULATORY_CARE_PROVIDER_SITE_OTHER): Payer: Medicare Other | Admitting: Podiatry

## 2015-12-09 DIAGNOSIS — L603 Nail dystrophy: Secondary | ICD-10-CM

## 2015-12-09 DIAGNOSIS — B351 Tinea unguium: Secondary | ICD-10-CM

## 2015-12-09 DIAGNOSIS — M79609 Pain in unspecified limb: Secondary | ICD-10-CM

## 2015-12-09 DIAGNOSIS — E0843 Diabetes mellitus due to underlying condition with diabetic autonomic (poly)neuropathy: Secondary | ICD-10-CM

## 2015-12-09 DIAGNOSIS — L608 Other nail disorders: Secondary | ICD-10-CM

## 2015-12-16 ENCOUNTER — Other Ambulatory Visit: Payer: Self-pay | Admitting: Family Medicine

## 2015-12-19 NOTE — Telephone Encounter (Signed)
Rx request Denied, Patient has not been seen in >1 year, no future appt scheduled [change in PCP/?]; pt needs to contact office/SLS 11/27

## 2016-01-01 NOTE — Progress Notes (Signed)
SUBJECTIVE Patient with a history of diabetes mellitus presents to office today complaining of elongated, thickened nails. Pain while ambulating in shoes. Patient is unable to trim their own nails.   No Known Allergies  OBJECTIVE General Patient is awake, alert, and oriented x 3 and in no acute distress. Derm Skin is dry and supple bilateral. Negative open lesions or macerations. Remaining integument unremarkable. Nails are tender, long, thickened and dystrophic with subungual debris, consistent with onychomycosis, 1-5 bilateral. No signs of infection noted. Vasc  DP and PT pedal pulses palpable bilaterally. Temperature gradient within normal limits.  Neuro Epicritic and protective threshold sensation diminished bilaterally.  Musculoskeletal Exam No symptomatic pedal deformities noted bilateral. Muscular strength within normal limits.  ASSESSMENT 1. Diabetes Mellitus w/ peripheral neuropathy 2. Onychomycosis of nail due to dermatophyte bilateral 3. Pain in foot bilateral  PLAN OF CARE 1. Patient evaluated today. 2. Instructed to maintain good pedal hygiene and foot care. Stressed importance of controlling blood sugar.  3. Mechanical debridement of nails 1-5 bilaterally performed using a nail nipper. Filed with dremel without incident.  4. Return to clinic in 3 mos.     Kasidi Shanker M. Janki Dike, DPM [facility]  Dr. Jermayne Sweeney M. Lashaye Fisk, DPM   [address]                                        [place], [place] [address]                Office [phone]  Fax [phone]    

## 2016-03-13 ENCOUNTER — Ambulatory Visit (INDEPENDENT_AMBULATORY_CARE_PROVIDER_SITE_OTHER): Payer: Medicare Other | Admitting: Podiatry

## 2016-03-13 ENCOUNTER — Encounter: Payer: Self-pay | Admitting: Podiatry

## 2016-03-13 DIAGNOSIS — M79609 Pain in unspecified limb: Secondary | ICD-10-CM

## 2016-03-13 DIAGNOSIS — B351 Tinea unguium: Secondary | ICD-10-CM | POA: Diagnosis not present

## 2016-03-13 DIAGNOSIS — E0843 Diabetes mellitus due to underlying condition with diabetic autonomic (poly)neuropathy: Secondary | ICD-10-CM | POA: Diagnosis not present

## 2016-03-13 DIAGNOSIS — L603 Nail dystrophy: Secondary | ICD-10-CM | POA: Diagnosis not present

## 2016-03-13 DIAGNOSIS — L608 Other nail disorders: Secondary | ICD-10-CM | POA: Diagnosis not present

## 2016-03-13 NOTE — Progress Notes (Signed)
   SUBJECTIVE Patient with a history of diabetes mellitus presents to office today complaining of elongated, thickened nails. Pain while ambulating in shoes. Patient is unable to trim their own nails.   OBJECTIVE General Patient is awake, alert, and oriented x 3 and in no acute distress. Derm Skin is dry and supple bilateral. Negative open lesions or macerations. Remaining integument unremarkable. Nails are tender, long, thickened and dystrophic with subungual debris, consistent with onychomycosis, 1-5 bilateral. No signs of infection noted. Vasc  DP and PT pedal pulses palpable bilaterally. Temperature gradient within normal limits.  Neuro Epicritic and protective threshold sensation diminished bilaterally.  Musculoskeletal Exam No symptomatic pedal deformities noted bilateral. Muscular strength within normal limits.  ASSESSMENT 1. Diabetes Mellitus w/ peripheral neuropathy 2. Onychomycosis of nail due to dermatophyte bilateral 3. Possible ingrown toenail left great toe medial border  PLAN OF CARE 1. Patient evaluated today. 2. Instructed to maintain good pedal hygiene and foot care. Stressed importance of controlling blood sugar.  3. Mechanical debridement of nails 1-5 bilaterally performed using a nail nipper. Filed with dremel without incident.  4. Recommend antibiotic ointment and a bandaid with daily epsom salt soaks to possible ingrown left great toe.  5. Return to clinic in 3 mos.     Felecia ShellingBrent M. Evans, DPM [facility]  Dr. Felecia ShellingBrent M. Evans, DPM    [address]                                        [place], [place] [address]                Office [phone]  Fax [phone]

## 2016-06-12 ENCOUNTER — Ambulatory Visit (INDEPENDENT_AMBULATORY_CARE_PROVIDER_SITE_OTHER): Payer: Medicare Other | Admitting: Podiatry

## 2016-06-12 ENCOUNTER — Encounter: Payer: Self-pay | Admitting: Podiatry

## 2016-06-12 DIAGNOSIS — L600 Ingrowing nail: Secondary | ICD-10-CM

## 2016-06-12 MED ORDER — DOXYCYCLINE HYCLATE 100 MG PO TABS: 100.0000 mg | ORAL_TABLET | Freq: Two times a day (BID) | ORAL | 0 refills | Status: DC

## 2016-06-12 NOTE — Progress Notes (Signed)
   Subjective: Patient presents today for evaluation of pain to the medial border of the right great toe. He reports associated redness and bleeding from the area. Patient is concerned for possible ingrown nail. Patient states that the pain has been present for a few weeks now. Patient presents today for further treatment and evaluation.  Objective:  General: Well developed, nourished, in no acute distress, alert and oriented x3   Dermatology: Skin is warm, dry and supple bilateral. Medial border of the right great toe appears to be erythematous with evidence of an ingrowing nail. Pain on palpation noted to the border of the nail fold. The remaining nails appear unremarkable at this time. There are no open sores, lesions.  Vascular: Dorsalis Pedis artery and Posterior Tibial artery pedal pulses palpable. No lower extremity edema noted.   Neruologic: Grossly intact via light touch bilateral.  Musculoskeletal: Muscular strength within normal limits in all groups bilateral. Normal range of motion noted to all pedal and ankle joints.   Assesement: #1 Paronychia with ingrowing nail medial border of the right great toe #2 Pain in toe #3 Incurvated nail  Plan of Care:  1. Patient evaluated.  2. Discussed treatment alternatives and plan of care. Explained nail avulsion procedure and post procedure course to patient. 3. Patient opted for permanent partial nail avulsion.  4. Prior to procedure, local anesthesia infiltration utilized using 3 ml of a 50:50 mixture of 2% plain lidocaine and 0.5% plain marcaine in a normal hallux block fashion and a betadine prep performed.  5. Partial permanent nail avulsion with chemical matrixectomy performed using 3x30sec applications of phenol followed by alcohol flush.  6. Light dressing applied. 7. Prescription for doxycycline 100 mg #20 given to patient. 8. Return to clinic in 2 weeks.   Felecia ShellingBrent M. Benedicto Capozzi, DPM [facility]  Dr. Felecia ShellingBrent M. Helmer Dull,  DPM    [address]                                        [place], [place] [address]                Office [phone]  Fax [phone]) [phone]

## 2016-06-12 NOTE — Patient Instructions (Signed)

## 2016-06-29 ENCOUNTER — Ambulatory Visit (INDEPENDENT_AMBULATORY_CARE_PROVIDER_SITE_OTHER): Payer: Medicare Other | Admitting: Podiatry

## 2016-06-29 ENCOUNTER — Encounter: Payer: Self-pay | Admitting: Podiatry

## 2016-06-29 DIAGNOSIS — M79676 Pain in unspecified toe(s): Secondary | ICD-10-CM | POA: Diagnosis not present

## 2016-06-29 DIAGNOSIS — B351 Tinea unguium: Secondary | ICD-10-CM

## 2016-07-01 NOTE — Progress Notes (Signed)
   SUBJECTIVE Patient  presents to office today complaining of elongated, thickened nails. Pain while ambulating in shoes. Patient is unable to trim their own nails.   OBJECTIVE General Patient is awake, alert, and oriented x 3 and in no acute distress. Derm Skin is dry and supple bilateral. Negative open lesions or macerations. Remaining integument unremarkable. Nails are tender, long, thickened and dystrophic with subungual debris, consistent with onychomycosis, 1-5 bilateral. No signs of infection noted. Vasc  DP and PT pedal pulses palpable bilaterally. Temperature gradient within normal limits.  Neuro Epicritic and protective threshold sensation diminished bilaterally.  Musculoskeletal Exam No symptomatic pedal deformities noted bilateral. Muscular strength within normal limits.  ASSESSMENT 1. Onychodystrophic nails 1-5 bilateral with hyperkeratosis of nails.  2. Onychomycosis of nail due to dermatophyte bilateral 3. Pain in foot bilateral  PLAN OF CARE 1. Patient evaluated today.  2. Instructed to maintain good pedal hygiene and foot care.  3. Mechanical debridement of nails 1-5 bilaterally performed using a nail nipper. Filed with dremel without incident.  4. Return to clinic in 3 mos.    Loel Betancur M. Asberry Lascola, DPM [facility]  Dr. Lakishia Bourassa M. Abdoulie Tierce, DPM    [address]                                        [place], [place] [address]                Office [phone]  Fax [phone]      

## 2016-09-04 ENCOUNTER — Encounter: Payer: Self-pay | Admitting: Podiatry

## 2016-09-04 ENCOUNTER — Ambulatory Visit (INDEPENDENT_AMBULATORY_CARE_PROVIDER_SITE_OTHER): Payer: Medicare Other | Admitting: Podiatry

## 2016-09-04 DIAGNOSIS — L600 Ingrowing nail: Secondary | ICD-10-CM

## 2016-09-10 NOTE — Progress Notes (Signed)
   Subjective: Patient presents today for evaluation of pain in toe(s). Patient is concerned for possible ingrown nail. Patient states that the pain has been present for a few weeks now. Patient presents today for further treatment and evaluation.  Objective:  General: Well developed, nourished, in no acute distress, alert and oriented x3   Dermatology: Skin is warm, dry and supple bilateral. Medial border of the left great toe appears to be erythematous with evidence of an ingrowing nail. Pain on palpation noted to the border of the nail fold. The remaining nails appear unremarkable at this time. There are no open sores, lesions.  Vascular: Dorsalis Pedis artery and Posterior Tibial artery pedal pulses palpable. No lower extremity edema noted.   Neruologic: Grossly intact via light touch bilateral.  Musculoskeletal: Muscular strength within normal limits in all groups bilateral. Normal range of motion noted to all pedal and ankle joints.   Assesement: #1 Paronychia with ingrowing nail medial border left great toe #2 Pain in toe #3 Incurvated nail  Plan of Care:  1. Patient evaluated.  2. Discussed treatment alternatives and plan of care. Explained nail avulsion procedure and post procedure course to patient. 3. Patient opted for permanent partial nail avulsion.  4. Prior to procedure, local anesthesia infiltration utilized using 3 ml of a 50:50 mixture of 2% plain lidocaine and 0.5% plain marcaine in a normal hallux block fashion and a betadine prep performed.  5. Partial permanent nail avulsion with chemical matrixectomy performed using 3x30sec applications of phenol followed by alcohol flush.  6. Light dressing applied. 7. Return to clinic in 2 weeks.   Ameirah Khatoon M. Undrea Shipes, DPM [facility]  Dr. Meckenzie Balsley M. Romuald Mccaslin, DPM    [address]                                        [place], [place] [address]                Office [phone]  Fax [phone]      

## 2016-09-18 ENCOUNTER — Ambulatory Visit (INDEPENDENT_AMBULATORY_CARE_PROVIDER_SITE_OTHER): Payer: Medicare Other | Admitting: Podiatry

## 2016-09-18 DIAGNOSIS — L600 Ingrowing nail: Secondary | ICD-10-CM | POA: Diagnosis not present

## 2016-09-18 DIAGNOSIS — B351 Tinea unguium: Secondary | ICD-10-CM

## 2016-09-18 DIAGNOSIS — M79676 Pain in unspecified toe(s): Secondary | ICD-10-CM

## 2016-09-24 NOTE — Progress Notes (Signed)
   Subjective: Patient presents today 2 weeks post ingrown nail permanent nail avulsion procedure. Patient states that the toe and nail fold is feeling much better.  Objective: Skin is warm, dry and supple. Nail and respective nail fold appears to be healing appropriately. Open wound to the associated nail fold with a granular wound base and moderate amount of fibrotic tissue. Minimal drainage noted. Mild erythema around the periungual region likely due to phenol chemical matricectomy.  Assessment: #1 postop permanent partial nail avulsion medial border of the left great toe #2 open wound periungual nail fold of respective digit.  #3 elongated dystrophic nails 1-5 bilateral  Plan of care: #1 patient was evaluated  #2 debridement of open wound was performed to the periungual border of the respective toe using a currette. Antibiotic ointment and Band-Aid was applied. #3 as per caregiver requests, mechanical debridement of nails 1-5 bilaterally was performed using a nail nipper without incident or bleeding.  #4 patient is to return to clinic on a PRN  basis.   Felecia ShellingBrent M. Evans, DPM [facility]  Dr. Felecia ShellingBrent M. Evans, DPM    [address]                                        [place], [place] [address]                Office [phone]  Fax [phone]

## 2016-12-21 ENCOUNTER — Ambulatory Visit (INDEPENDENT_AMBULATORY_CARE_PROVIDER_SITE_OTHER): Payer: Medicare Other | Admitting: Podiatry

## 2016-12-21 DIAGNOSIS — E0843 Diabetes mellitus due to underlying condition with diabetic autonomic (poly)neuropathy: Secondary | ICD-10-CM

## 2016-12-21 DIAGNOSIS — M79676 Pain in unspecified toe(s): Secondary | ICD-10-CM

## 2016-12-21 DIAGNOSIS — B351 Tinea unguium: Secondary | ICD-10-CM

## 2016-12-24 NOTE — Progress Notes (Signed)
   SUBJECTIVE Patient with a history of diabetes mellitus presents to office today complaining of elongated, thickened nails. Pain while ambulating in shoes. Patient is unable to trim their own nails.   Past Medical History:  Diagnosis Date  . Autism spectrum disorder   . Hypothyroidism   . Seizure (HCC) 2012   hypoglycemia induced - s/p eval by neuro  . Thyroid nodule    by prior US  . Type 1 diabetes (HCC) 2000    OBJECTIVE General Patient is awake, alert, and oriented x 3 and in no acute distress. Derm Skin is dry and supple bilateral. Negative open lesions or macerations. Remaining integument unremarkable. Nails are tender, long, thickened and dystrophic with subungual debris, consistent with onychomycosis, 1-5 bilateral. No signs of infection noted. Vasc  DP and PT pedal pulses palpable bilaterally. Temperature gradient within normal limits.  Neuro Epicritic and protective threshold sensation diminished bilaterally.  Musculoskeletal Exam No symptomatic pedal deformities noted bilateral. Muscular strength within normal limits.  ASSESSMENT 1. Diabetes Mellitus w/ peripheral neuropathy 2. Onychomycosis of nail due to dermatophyte bilateral 3. Pain in foot bilateral  PLAN OF CARE 1. Patient evaluated today. 2. Instructed to maintain good pedal hygiene and foot care. Stressed importance of controlling blood sugar.  3. Mechanical debridement of nails 1-5 bilaterally performed using a nail nipper. Filed with dremel without incident.  4. Return to clinic in 3 mos.     Felecia ShellingBrent M. Bettie Swavely, DPM [facility]  Dr. Felecia ShellingBrent M. Graeden Bitner, DPM    [address]                                        [place], [place] [address]                Office [phone]  Fax [phone]

## 2017-03-22 ENCOUNTER — Ambulatory Visit (INDEPENDENT_AMBULATORY_CARE_PROVIDER_SITE_OTHER): Payer: Medicare Other | Admitting: Podiatry

## 2017-03-22 ENCOUNTER — Encounter: Payer: Self-pay | Admitting: Podiatry

## 2017-03-22 DIAGNOSIS — B351 Tinea unguium: Secondary | ICD-10-CM | POA: Diagnosis not present

## 2017-03-22 DIAGNOSIS — E0843 Diabetes mellitus due to underlying condition with diabetic autonomic (poly)neuropathy: Secondary | ICD-10-CM

## 2017-03-22 DIAGNOSIS — M79676 Pain in unspecified toe(s): Secondary | ICD-10-CM

## 2017-03-25 NOTE — Progress Notes (Signed)
   SUBJECTIVE Patient with a history of diabetes mellitus presents to office today complaining of elongated, thickened nails. Pain while ambulating in shoes. Patient is unable to trim their own nails.   Past Medical History:  Diagnosis Date  . Autism spectrum disorder   . Hypothyroidism   . Seizure (HCC) 2012   hypoglycemia induced - s/p eval by neuro  . Thyroid nodule    by prior US  . Type 1 diabetes (HCC) 2000    OBJECTIVE General Patient is awake, alert, and oriented x 3 and in no acute distress. Derm Skin is dry and supple bilateral. Negative open lesions or macerations. Remaining integument unremarkable. Nails are tender, long, thickened and dystrophic with subungual debris, consistent with onychomycosis, 1-5 bilateral. No signs of infection noted. Vasc  DP and PT pedal pulses palpable bilaterally. Temperature gradient within normal limits.  Neuro Epicritic and protective threshold sensation diminished bilaterally.  Musculoskeletal Exam No symptomatic pedal deformities noted bilateral. Muscular strength within normal limits.  ASSESSMENT 1. Diabetes Mellitus w/ peripheral neuropathy 2. Onychomycosis of nail due to dermatophyte bilateral 3. Pain in foot bilateral  PLAN OF CARE 1. Patient evaluated today. 2. Instructed to maintain good pedal hygiene and foot care. Stressed importance of controlling blood sugar.  3. Mechanical debridement of nails 1-5 bilaterally performed using a nail nipper. Filed with dremel without incident.  4. Return to clinic in 3 mos.     Brent M. Evans, DPM [facility]  Dr. Brent M. Evans, DPM    [address]                                        [place], [place] [address]                Office [phone]  Fax [phone]      

## 2017-06-25 ENCOUNTER — Ambulatory Visit (INDEPENDENT_AMBULATORY_CARE_PROVIDER_SITE_OTHER): Payer: Medicare Other | Admitting: Podiatry

## 2017-06-25 ENCOUNTER — Encounter: Payer: Self-pay | Admitting: Podiatry

## 2017-06-25 DIAGNOSIS — B351 Tinea unguium: Secondary | ICD-10-CM

## 2017-06-25 DIAGNOSIS — M79676 Pain in unspecified toe(s): Secondary | ICD-10-CM

## 2017-06-25 DIAGNOSIS — E0843 Diabetes mellitus due to underlying condition with diabetic autonomic (poly)neuropathy: Secondary | ICD-10-CM

## 2017-06-27 NOTE — Progress Notes (Signed)
   SUBJECTIVE Patient with a history of diabetes mellitus presents to office today complaining of elongated, thickened nails that cause pain while ambulating in shoes. He is unable to trim his own nails. Patient is here for further evaluation and treatment.   Past Medical History:  Diagnosis Date  . Autism spectrum disorder   . Hypothyroidism   . Seizure (HCC) 2012   hypoglycemia induced - s/p eval by neuro  . Thyroid nodule    by prior US  . Type 1 diabetes (HCC) 2000    OBJECTIVE General Patient is awake, alert, and oriented x 3 and in no acute distress. Derm Skin is dry and supple bilateral. Negative open lesions or macerations. Remaining integument unremarkable. Nails are tender, long, thickened and dystrophic with subungual debris, consistent with onychomycosis, 1-5 bilateral. No signs of infection noted. Vasc  DP and PT pedal pulses palpable bilaterally. Temperature gradient within normal limits.  Neuro Epicritic and protective threshold sensation diminished bilaterally.  Musculoskeletal Exam No symptomatic pedal deformities noted bilateral. Muscular strength within normal limits.  ASSESSMENT 1. Diabetes Mellitus w/ peripheral neuropathy 2. Onychomycosis of nail due to dermatophyte bilateral 3. Pain in foot bilateral  PLAN OF CARE 1. Patient evaluated today. 2. Instructed to maintain good pedal hygiene and foot care. Stressed importance of controlling blood sugar.  3. Mechanical debridement of nails 1-5 bilaterally performed using a nail nipper. Filed with dremel without incident.  4. Return to clinic in 3 mos.     Brent M. Evans, DPM [facility]  Dr. Brent M. Evans, DPM    [address]                                        [place], [place] [address]                Office [phone]  Fax [phone]      

## 2017-10-25 ENCOUNTER — Ambulatory Visit (INDEPENDENT_AMBULATORY_CARE_PROVIDER_SITE_OTHER): Payer: Medicare Other | Admitting: Podiatry

## 2017-10-25 ENCOUNTER — Encounter: Payer: Self-pay | Admitting: Podiatry

## 2017-10-25 DIAGNOSIS — B351 Tinea unguium: Secondary | ICD-10-CM | POA: Diagnosis not present

## 2017-10-25 DIAGNOSIS — E0843 Diabetes mellitus due to underlying condition with diabetic autonomic (poly)neuropathy: Secondary | ICD-10-CM

## 2017-10-25 DIAGNOSIS — M79676 Pain in unspecified toe(s): Secondary | ICD-10-CM

## 2017-10-27 NOTE — Progress Notes (Signed)
   SUBJECTIVE Patient with a history of diabetes mellitus presents to office today complaining of elongated, thickened nails that cause pain while ambulating in shoes. He is unable to trim his own nails. Patient is here for further evaluation and treatment.   Past Medical History:  Diagnosis Date  . Autism spectrum disorder   . Hypothyroidism   . Seizure (HCC) 2012   hypoglycemia induced - s/p eval by neuro  . Thyroid nodule    by prior US  . Type 1 diabetes (HCC) 2000    OBJECTIVE General Patient is awake, alert, and oriented x 3 and in no acute distress. Derm Skin is dry and supple bilateral. Negative open lesions or macerations. Remaining integument unremarkable. Nails are tender, long, thickened and dystrophic with subungual debris, consistent with onychomycosis, 1-5 bilateral. No signs of infection noted. Vasc  DP and PT pedal pulses palpable bilaterally. Temperature gradient within normal limits.  Neuro Epicritic and protective threshold sensation diminished bilaterally.  Musculoskeletal Exam No symptomatic pedal deformities noted bilateral. Muscular strength within normal limits.  ASSESSMENT 1. Diabetes Mellitus w/ peripheral neuropathy 2. Onychomycosis of nail due to dermatophyte bilateral 3. Pain in foot bilateral  PLAN OF CARE 1. Patient evaluated today. 2. Instructed to maintain good pedal hygiene and foot care. Stressed importance of controlling blood sugar.  3. Mechanical debridement of nails 1-5 bilaterally performed using a nail nipper. Filed with dremel without incident.  4. Return to clinic in 3 mos.     Yulisa Chirico M. Nakesha Ebrahim, DPM [facility]  Dr. Jawann Urbani M. Nicholous Girgenti, DPM    [address]                                        , [place] [address]                Office [phone]  Fax [phone]      

## 2018-02-28 ENCOUNTER — Encounter: Payer: Self-pay | Admitting: Podiatry

## 2018-02-28 ENCOUNTER — Ambulatory Visit (INDEPENDENT_AMBULATORY_CARE_PROVIDER_SITE_OTHER): Payer: Medicare Other | Admitting: Podiatry

## 2018-02-28 DIAGNOSIS — B351 Tinea unguium: Secondary | ICD-10-CM

## 2018-02-28 DIAGNOSIS — M79676 Pain in unspecified toe(s): Secondary | ICD-10-CM

## 2018-02-28 DIAGNOSIS — E0843 Diabetes mellitus due to underlying condition with diabetic autonomic (poly)neuropathy: Secondary | ICD-10-CM | POA: Diagnosis not present

## 2018-03-02 NOTE — Progress Notes (Signed)
   SUBJECTIVE Patient with a history of diabetes mellitus presents to office today complaining of elongated, thickened nails that cause pain while ambulating in shoes. He is unable to trim his own nails. Patient is here for further evaluation and treatment.   Past Medical History:  Diagnosis Date  . Autism spectrum disorder   . Hypothyroidism   . Seizure (HCC) 2012   hypoglycemia induced - s/p eval by neuro  . Thyroid nodule    by prior US  . Type 1 diabetes (HCC) 2000    OBJECTIVE General Patient is awake, alert, and oriented x 3 and in no acute distress. Derm Skin is dry and supple bilateral. Negative open lesions or macerations. Remaining integument unremarkable. Nails are tender, long, thickened and dystrophic with subungual debris, consistent with onychomycosis, 1-5 bilateral. No signs of infection noted. Vasc  DP and PT pedal pulses palpable bilaterally. Temperature gradient within normal limits.  Neuro Epicritic and protective threshold sensation diminished bilaterally.  Musculoskeletal Exam No symptomatic pedal deformities noted bilateral. Muscular strength within normal limits.  ASSESSMENT 1. Diabetes Mellitus w/ peripheral neuropathy 2. Onychomycosis of nail due to dermatophyte bilateral 3. Pain in foot bilateral  PLAN OF CARE 1. Patient evaluated today. 2. Instructed to maintain good pedal hygiene and foot care. Stressed importance of controlling blood sugar.  3. Mechanical debridement of nails 1-5 bilaterally performed using a nail nipper. Filed with dremel without incident.  4. Return to clinic in 3 mos.     Francisco Colon, DPM [facility]  Dr. Harvir Patry M. Eldonna Colon, DPM    [address]                                        [place], [place] [address]                Office [phone]  Fax [phone]      

## 2018-04-16 DIAGNOSIS — Z794 Long term (current) use of insulin: Secondary | ICD-10-CM | POA: Insufficient documentation

## 2018-04-30 DIAGNOSIS — E063 Autoimmune thyroiditis: Secondary | ICD-10-CM | POA: Insufficient documentation

## 2018-05-01 DIAGNOSIS — E10649 Type 1 diabetes mellitus with hypoglycemia without coma: Secondary | ICD-10-CM | POA: Insufficient documentation

## 2018-05-27 ENCOUNTER — Ambulatory Visit (INDEPENDENT_AMBULATORY_CARE_PROVIDER_SITE_OTHER): Payer: Medicare Other | Admitting: Podiatry

## 2018-05-27 ENCOUNTER — Ambulatory Visit: Payer: Medicare Other | Admitting: Podiatry

## 2018-05-27 ENCOUNTER — Other Ambulatory Visit: Payer: Self-pay

## 2018-05-27 ENCOUNTER — Encounter: Payer: Self-pay | Admitting: Podiatry

## 2018-05-27 DIAGNOSIS — M79676 Pain in unspecified toe(s): Secondary | ICD-10-CM

## 2018-05-27 DIAGNOSIS — B351 Tinea unguium: Secondary | ICD-10-CM | POA: Diagnosis not present

## 2018-05-27 DIAGNOSIS — E0843 Diabetes mellitus due to underlying condition with diabetic autonomic (poly)neuropathy: Secondary | ICD-10-CM

## 2018-05-27 NOTE — Progress Notes (Signed)
   SUBJECTIVE Patient with a history of diabetes mellitus presents to office today complaining of elongated, thickened nails that cause pain while ambulating in shoes. He is unable to trim his own nails. Patient is here for further evaluation and treatment.   Past Medical History:  Diagnosis Date  . Autism spectrum disorder   . Hypothyroidism   . Seizure (HCC) 2012   hypoglycemia induced - s/p eval by neuro  . Thyroid nodule    by prior US  . Type 1 diabetes (HCC) 2000    OBJECTIVE General Patient is awake, alert, and oriented x 3 and in no acute distress. Derm Skin is dry and supple bilateral. Negative open lesions or macerations. Remaining integument unremarkable. Nails are tender, long, thickened and dystrophic with subungual debris, consistent with onychomycosis, 1-5 bilateral. No signs of infection noted. Vasc  DP and PT pedal pulses palpable bilaterally. Temperature gradient within normal limits.  Neuro Epicritic and protective threshold sensation diminished bilaterally.  Musculoskeletal Exam No symptomatic pedal deformities noted bilateral. Muscular strength within normal limits.  ASSESSMENT 1. Diabetes Mellitus w/ peripheral neuropathy 2. Onychomycosis of nail due to dermatophyte bilateral 3. Pain in foot bilateral  PLAN OF CARE 1. Patient evaluated today. 2. Instructed to maintain good pedal hygiene and foot care. Stressed importance of controlling blood sugar.  3. Mechanical debridement of nails 1-5 bilaterally performed using a nail nipper. Filed with dremel without incident.  4. Return to clinic in 3 mos.     Felecia Shelling, DPM [facility]  Dr. Felecia Shelling, DPM    [address]                                        [place], [place] [address]                Office [phone]  Fax [phone]

## 2018-08-04 ENCOUNTER — Other Ambulatory Visit: Payer: Self-pay

## 2018-08-04 ENCOUNTER — Encounter: Payer: Self-pay | Admitting: Dietician

## 2018-08-04 ENCOUNTER — Encounter: Payer: Medicare Other | Attending: Internal Medicine | Admitting: Dietician

## 2018-08-04 VITALS — BP 128/84 | Ht 67.0 in | Wt 159.5 lb

## 2018-08-04 DIAGNOSIS — E109 Type 1 diabetes mellitus without complications: Secondary | ICD-10-CM | POA: Insufficient documentation

## 2018-08-04 NOTE — Progress Notes (Signed)
Diabetes Self-Management Education  Visit Type: First/Initial  Appt. Start Time: 1315 Appt. End Time: 1415  08/04/2018  [name], identified by name and date of birth, is a 28 y.o. male with a diagnosis of Diabetes: Type 1.   ASSESSMENT  Blood pressure 128/84, height 5\' 7"  (1.702 m), weight 159 lb 8 oz (72.3 kg). Body mass index is 24.98 kg/m.  Diabetes Self-Management Education - 08/04/18 1413      Visit Information   Visit Type  First/Initial      Initial Visit   Diabetes Type  Type 1      Health Coping   How would you rate your overall health?  Excellent      Psychosocial Assessment   Patient Belief/Attitude about Diabetes  --   pt has autism-uncle present-pt agitated and muttering at times during visit   Self-management support  Family;Doctor's office    Other persons present  Patient;Family Member    Patient Concerns  Healthy Lifestyle;Glycemic Control    Special Needs  --   pt has autism and agitated-got up walking in room at times muttering incoherently during appt   Preferred Learning Style  Hands on;Visual;Auditory    Learning Readiness  --   family administers pt's inulin and manages his diet and diabetes care     Pre-Education Assessment   Patient understands the diabetes disease and treatment process.  Needs Instruction    Patient understands incorporating nutritional management into lifestyle.  Needs Instruction    Patient undertands incorporating physical activity into lifestyle.  Needs Instruction    Patient understands using medications safely.  Needs Instruction   family administers pt's insulin   Patient understands monitoring blood glucose, interpreting and using results  Needs Instruction   family checks BG's 3-4x/day   Patient understands prevention, detection, and treatment of acute complications.  Needs Instruction    Patient understands prevention, detection, and treatment of chronic complications.  Needs Instruction    Patient  understands how to develop strategies to address psychosocial issues.  Needs Instruction    Patient understands how to develop strategies to promote health/change behavior.  Needs Instruction      Complications   How often do you check your blood sugar?  3 times/day (before meals)   Fasting Blood glucose range (mg/dL)  16-109;[id number];130-179    Postprandial Blood glucose range (mg/dL)  98-119;147-82970-129;130-179    Number of hypoglycemic episodes per month  1   low BG x1 past month(40's)   Have you had a dilated eye exam in the past 12 months?  Yes   08-20-18   Have you had a dental exam in the past 12 months?  Yes   07-31-18   Are you checking your feet?  Yes    How many days per week are you checking your feet?  7      Dietary Intake   Breakfast  eats breakfast at 9a=ham/cheese crossiant or cold cereal with milk    Snack (morning)  none    Lunch  eats lunch at 1p=chicken sandwich + occasional fries (eats fried foods 3x/wk)   Snack (afternoon)  eats chips or snack foods at 3p    Dinner  eats supper at 5p=meat and vegetables    Snack (evening)  none    Beverage(s)  drinks water 8+x/day, diet soda 1x/day, milk 1x/day and fruit juice 1x/day      Exercise   Exercise Type  ADL's      Patient Education   Previous  Diabetes Education  Yes (please comment)   family has had diabetes education at [facility] with pt in past   Disease state   Explored patient's options for treatment of their diabetes    Nutrition management   Role of diet in the treatment of diabetes and the relationship between the three main macronutrients and blood glucose level;Food label reading, portion sizes and measuring food.;Carbohydrate counting    Physical activity and exercise   Role of exercise on diabetes management, blood pressure control and cardiac health.    Medications  Taught/reviewed insulin injection, site rotation, insulin storage and needle disposal.;Reviewed patients medication for diabetes, action, purpose, timing of dose and  side effects.    Monitoring  Purpose and frequency of SMBG.;Taught/discussed recording of test results and interpretation of SMBG.    Acute complications  Taught treatment of hypoglycemia - the 15 rule.    Chronic complications  Relationship between chronic complications and blood glucose control    Personal strategies to promote health  Lifestyle issues that need to be addressed for better diabetes care      Outcomes   Expected Outcomes  Demonstrated interest in learning. Expect positive outcomes       Individualized Plan for Diabetes Self-Management Training:   Learning Objective:  Patient will have a greater understanding of diabetes self-management. Patient education plan is to attend individual and/or group sessions per assessed needs and concerns.   Plan:   Check BG's 4x/day before meals and at bedtime and record  Bring BG record to next appointment  Eat 3 meals/day and 1 snack at bedtime  Space meals 4-5 hours apart  Avoid sweetened beverages and fruit juices  Continue to drink plenty of water  Limit intake of sweets, fried foods and snack foods  Eat 3-4 carbohydrate servings/meal + protein  Eat 1 carbohydrate serving/snack + protein  Complete a 3 day food record and bring to next appointment  Carry fast acting glucose at all times  Carry medical alert ID at all times  Rotate injection sites  Mix Novolog 70/30 insulin gently in pen before taking dose  Take Novolog 70/30 insulin 15 minutes before eating BID (22 units before breakfast and 30 units before supper)  Expected Outcomes:  Demonstrated interest in learning. Expect positive outcomes  Education material provided: General meal planning guidelines, low BG handout, Food Group handout, 1 tube glucose tablets for PRN use  If problems or questions, patient to contact team via:  [phone]  Future DSME appointment:  08-27-18

## 2018-08-04 NOTE — Patient Instructions (Signed)
   Check blood sugars 4 x day before each meal and before bed every day and record  Bring blood sugar records to the next appointment  Eat 3 meals day and 1  snack a day at bedtime  Space meals 4-5 hours apart  Avoid sugar sweetened drinks (soda, tea, coffee, sports drinks, juices)  Continue to drink plenty of water  Limit intake of sweets, fried foods and snack foods  Eat 3-4 carbohydrate servings/meal + protein  Eat 1 carbohydrate serving/snack + protein  Complete 3 Day Food Record and bring to next appt  Carry fast acting glucose and a snack at all times  Rotate injection sites  Mix Novolog 70/30  insulin in pen gently before taking dose  Take Novolog 70/30  (22 units ac breakfast and 30 units ac supper) 15 min before eating breakfast and supper when able  Return for appointment/classes on:  08-27-18

## 2018-08-27 ENCOUNTER — Ambulatory Visit: Payer: Medicare Other | Admitting: Dietician

## 2018-09-01 ENCOUNTER — Encounter: Payer: Self-pay | Admitting: Dietician

## 2018-09-01 NOTE — Progress Notes (Signed)
Patient and caregiver did not keep his appointment for 08/27/18. When contacted by phone, patient's caregiver (uncle) stated he would reschedule at a later time.

## 2018-09-02 ENCOUNTER — Other Ambulatory Visit: Payer: Self-pay

## 2018-09-02 ENCOUNTER — Encounter: Payer: Self-pay | Admitting: Podiatry

## 2018-09-02 ENCOUNTER — Ambulatory Visit (INDEPENDENT_AMBULATORY_CARE_PROVIDER_SITE_OTHER): Payer: Medicare Other | Admitting: Podiatry

## 2018-09-02 DIAGNOSIS — M79676 Pain in unspecified toe(s): Secondary | ICD-10-CM | POA: Diagnosis not present

## 2018-09-02 DIAGNOSIS — E0843 Diabetes mellitus due to underlying condition with diabetic autonomic (poly)neuropathy: Secondary | ICD-10-CM

## 2018-09-02 DIAGNOSIS — B351 Tinea unguium: Secondary | ICD-10-CM | POA: Diagnosis not present

## 2018-09-05 NOTE — Progress Notes (Signed)
   SUBJECTIVE Patient presents to office today complaining of elongated, thickened nails that cause pain while ambulating in shoes. He is unable to trim his own nails. Patient is here for further evaluation and treatment.  Past Medical History:  Diagnosis Date  . Autism spectrum disorder   . Hypothyroidism   . Seizure ([place]) 2012   hypoglycemia induced - s/p eval by neuro  . Thyroid nodule    by prior US  . Type 1 diabetes ([facility]) 2000    OBJECTIVE General Patient is awake, alert, and oriented x 3 and in no acute distress. Derm Skin is dry and supple bilateral. Negative open lesions or macerations. Remaining integument unremarkable. Nails are tender, long, thickened and dystrophic with subungual debris, consistent with onychomycosis, 1-5 bilateral. No signs of infection noted. Vasc  DP and PT pedal pulses palpable bilaterally. Temperature gradient within normal limits.  Neuro Epicritic and protective threshold sensation grossly intact bilaterally.  Musculoskeletal Exam No symptomatic pedal deformities noted bilateral. Muscular strength within normal limits.  ASSESSMENT 1. Onychodystrophic nails 1-5 bilateral with hyperkeratosis of nails.  2. Onychomycosis of nail due to dermatophyte bilateral 3. Pain in foot bilateral  PLAN OF CARE 1. Patient evaluated today.  2. Instructed to maintain good pedal hygiene and foot care.  3. Mechanical debridement of nails 1-5 bilaterally performed using a nail nipper. Filed with dremel without incident.  4. Return to clinic in 3 mos.    Edrick Kins, DPM [facility]  Dr. Edrick Kins, [place]                                        [place], [place] [address]                Office [phone]  Fax [phone]

## 2018-09-19 ENCOUNTER — Encounter: Payer: Self-pay | Admitting: Dietician

## 2018-09-19 NOTE — Progress Notes (Signed)
Have not heard back from Mr. Rondel Jumbo, Mr. Shafer's uncle and caregiver, to reschedule the missed appointment from 08/27/18. Sent notification to referring provider.

## 2018-09-26 ENCOUNTER — Other Ambulatory Visit: Payer: Self-pay

## 2018-09-26 ENCOUNTER — Inpatient Hospital Stay: Payer: Medicare Other

## 2018-09-26 ENCOUNTER — Emergency Department: Payer: Medicare Other

## 2018-09-26 ENCOUNTER — Encounter: Payer: Self-pay | Admitting: Emergency Medicine

## 2018-09-26 ENCOUNTER — Inpatient Hospital Stay
Admission: EM | Admit: 2018-09-26 | Discharge: 2018-09-27 | DRG: 638 | Disposition: A | Discharge status: Home / Self Care | Payer: Medicare Other | Attending: Internal Medicine | Admitting: Internal Medicine

## 2018-09-26 DIAGNOSIS — Z20828 Contact with and (suspected) exposure to other viral communicable diseases: Secondary | ICD-10-CM | POA: Diagnosis not present

## 2018-09-26 DIAGNOSIS — Z79899 Other long term (current) drug therapy: Secondary | ICD-10-CM | POA: Diagnosis not present

## 2018-09-26 DIAGNOSIS — E039 Hypothyroidism, unspecified: Secondary | ICD-10-CM | POA: Diagnosis not present

## 2018-09-26 DIAGNOSIS — R109 Unspecified abdominal pain: Secondary | ICD-10-CM | POA: Diagnosis present

## 2018-09-26 DIAGNOSIS — Y636 Underdosing and nonadministration of necessary drug, medicament or biological substance: Secondary | ICD-10-CM | POA: Diagnosis not present

## 2018-09-26 DIAGNOSIS — Z833 Family history of diabetes mellitus: Secondary | ICD-10-CM | POA: Diagnosis not present

## 2018-09-26 DIAGNOSIS — F840 Autistic disorder: Secondary | ICD-10-CM | POA: Diagnosis not present

## 2018-09-26 DIAGNOSIS — G40909 Epilepsy, unspecified, not intractable, without status epilepticus: Secondary | ICD-10-CM | POA: Diagnosis not present

## 2018-09-26 DIAGNOSIS — R112 Nausea with vomiting, unspecified: Secondary | ICD-10-CM

## 2018-09-26 DIAGNOSIS — D573 Sickle-cell trait: Secondary | ICD-10-CM | POA: Diagnosis present

## 2018-09-26 DIAGNOSIS — Z79890 Hormone replacement therapy (postmenopausal): Secondary | ICD-10-CM | POA: Diagnosis not present

## 2018-09-26 DIAGNOSIS — E041 Nontoxic single thyroid nodule: Secondary | ICD-10-CM | POA: Diagnosis present

## 2018-09-26 DIAGNOSIS — Z8249 Family history of ischemic heart disease and other diseases of the circulatory system: Secondary | ICD-10-CM

## 2018-09-26 DIAGNOSIS — E1110 Type 2 diabetes mellitus with ketoacidosis without coma: Secondary | ICD-10-CM | POA: Diagnosis present

## 2018-09-26 DIAGNOSIS — E1010 Type 1 diabetes mellitus with ketoacidosis without coma: Principal | ICD-10-CM | POA: Diagnosis present

## 2018-09-26 DIAGNOSIS — Z794 Long term (current) use of insulin: Secondary | ICD-10-CM

## 2018-09-26 HISTORY — DX: Type 2 diabetes mellitus with ketoacidosis without coma: E11.10

## 2018-09-26 LAB — BASIC METABOLIC PANEL
Anion gap: 26 — ABNORMAL HIGH (ref 5–15)
Anion gap: 20 — ABNORMAL HIGH (ref 5–15)
Anion gap: 11 (ref 5–15)
BUN: 32 mg/dL — ABNORMAL HIGH (ref 6–20)
BUN: 28 mg/dL — ABNORMAL HIGH (ref 6–20)
BUN: 21 mg/dL — ABNORMAL HIGH (ref 6–20)
CO2: 15 mmol/L — ABNORMAL LOW (ref 22–32)
CO2: 17 mmol/L — ABNORMAL LOW (ref 22–32)
CO2: 20 mmol/L — ABNORMAL LOW (ref 22–32)
Calcium: 10.4 mg/dL — ABNORMAL HIGH (ref 8.9–10.3)
Calcium: 9.6 mg/dL (ref 8.9–10.3)
Calcium: 9.2 mg/dL (ref 8.9–10.3)
Chloride: 101 mmol/L (ref 98–111)
Chloride: 105 mmol/L (ref 98–111)
Chloride: 113 mmol/L — ABNORMAL HIGH (ref 98–111)
Creatinine, Ser: 1.65 mg/dL — ABNORMAL HIGH (ref 0.61–1.24)
Creatinine, Ser: 1.43 mg/dL — ABNORMAL HIGH (ref 0.61–1.24)
Creatinine, Ser: 0.98 mg/dL (ref 0.61–1.24)
GFR calc Af Amer: >60 mL/min (ref >60)
GFR calc Af Amer: >60 mL/min (ref >60)
GFR calc Af Amer: >60 mL/min (ref >60)
GFR calc non Af Amer: 56 mL/min — ABNORMAL LOW (ref >60)
GFR calc non Af Amer: >60 mL/min (ref >60)
GFR calc non Af Amer: >60 mL/min (ref >60)
Glucose, Bld: 434 mg/dL — ABNORMAL HIGH (ref 70–99)
Glucose, Bld: 326 mg/dL — ABNORMAL HIGH (ref 70–99)
Glucose, Bld: 194 mg/dL — ABNORMAL HIGH (ref 70–99)
Potassium: 4.9 mmol/L (ref 3.5–5.1)
Potassium: 5 mmol/L (ref 3.5–5.1)
Potassium: 4.1 mmol/L (ref 3.5–5.1)
Sodium: 142 mmol/L (ref 135–145)
Sodium: 142 mmol/L (ref 135–145)
Sodium: 144 mmol/L (ref 135–145)

## 2018-09-26 LAB — BLOOD GAS, VENOUS
Acid-base deficit: 8.8 mmol/L — ABNORMAL HIGH (ref 0.0–2.0)
Bicarbonate: 18 mmol/L — ABNORMAL LOW (ref 20.0–28.0)
O2 Saturation: 63.2 %
Patient temperature: 37
pCO2, Ven: 41 mmHg — ABNORMAL LOW (ref 44.0–60.0)
pH, Ven: 7.25 (ref 7.250–7.430)
pO2, Ven: 39 mmHg (ref 32.0–45.0)

## 2018-09-26 LAB — CBC
HCT: 49.5 % (ref 39.0–52.0)
Hemoglobin: 16.5 g/dL (ref 13.0–17.0)
MCH: 26 pg (ref 26.0–34.0)
MCHC: 33.3 g/dL (ref 30.0–36.0)
MCV: 78 fL — ABNORMAL LOW (ref 80.0–100.0)
Platelets: 292 10*3/uL (ref 150–400)
RBC: 6.35 MIL/uL — ABNORMAL HIGH (ref 4.22–5.81)
RDW: 12.2 % (ref 11.5–15.5)
WBC: 20.4 10*3/uL — ABNORMAL HIGH (ref 4.0–10.5)
nRBC: 0 % (ref 0.0–0.2)

## 2018-09-26 LAB — GLUCOSE, CAPILLARY
Glucose-Capillary: 406 mg/dL — ABNORMAL HIGH (ref 70–99)
Glucose-Capillary: 341 mg/dL — ABNORMAL HIGH (ref 70–99)
Glucose-Capillary: 242 mg/dL — ABNORMAL HIGH (ref 70–99)
Glucose-Capillary: 219 mg/dL — ABNORMAL HIGH (ref 70–99)
Glucose-Capillary: 216 mg/dL — ABNORMAL HIGH (ref 70–99)
Glucose-Capillary: 202 mg/dL — ABNORMAL HIGH (ref 70–99)
Glucose-Capillary: 153 mg/dL — ABNORMAL HIGH (ref 70–99)
Glucose-Capillary: 133 mg/dL — ABNORMAL HIGH (ref 70–99)
Glucose-Capillary: 129 mg/dL — ABNORMAL HIGH (ref 70–99)
Glucose-Capillary: 106 mg/dL — ABNORMAL HIGH (ref 70–99)

## 2018-09-26 LAB — HEMOGLOBIN A1C
Hgb A1c MFr Bld: 8.9 % — ABNORMAL HIGH (ref 4.8–5.6)
Mean Plasma Glucose: 208.73 mg/dL

## 2018-09-26 LAB — PHOSPHORUS: Phosphorus: 4.2 mg/dL (ref 2.5–4.6)

## 2018-09-26 LAB — MAGNESIUM: Magnesium: 2.2 mg/dL (ref 1.7–2.4)

## 2018-09-26 MED ORDER — INSULIN REGULAR(HUMAN) IN NACL 100-0.9 UT/100ML-% IV SOLN
INTRAVENOUS | Status: DC
  Administered 2018-09-26: 13:00:00 2.8 [IU]/h via INTRAVENOUS
  Filled 2018-09-26: qty 100

## 2018-09-26 MED ORDER — LACTATED RINGERS IV SOLN
INTRAVENOUS | Status: DC
  Administered 2018-09-26 (×2): via INTRAVENOUS

## 2018-09-26 MED ORDER — INSULIN GLARGINE 100 UNIT/ML ~~LOC~~ SOLN
10.0000 [IU] | Freq: Every day | SUBCUTANEOUS | Status: DC
  Administered 2018-09-27: 10 [IU] via SUBCUTANEOUS
  Filled 2018-09-26 (×2): qty 0.1

## 2018-09-26 MED ORDER — POTASSIUM CHLORIDE 10 MEQ/100ML IV SOLN
10.0000 meq | INTRAVENOUS | Status: AC
  Administered 2018-09-26 (×2): 10 meq via INTRAVENOUS
  Filled 2018-09-26 (×2): qty 100

## 2018-09-26 MED ORDER — INSULIN ASPART 100 UNIT/ML ~~LOC~~ SOLN
0.0000 [IU] | Freq: Every day | SUBCUTANEOUS | Status: DC
  Filled 2018-09-26: qty 0.05

## 2018-09-26 MED ORDER — LACTATED RINGERS IV BOLUS
1000.0000 mL | Freq: Once | INTRAVENOUS | Status: AC
  Administered 2018-09-26: 1000 mL via INTRAVENOUS

## 2018-09-26 MED ORDER — LEVETIRACETAM 500 MG PO TABS
500.0000 mg | ORAL_TABLET | Freq: Two times a day (BID) | ORAL | Status: DC
  Administered 2018-09-26 – 2018-09-27 (×2): 500 mg via ORAL
  Filled 2018-09-26 (×4): qty 1

## 2018-09-26 MED ORDER — SODIUM CHLORIDE 0.9 % IV BOLUS
1000.0000 mL | Freq: Once | INTRAVENOUS | Status: AC
  Administered 2018-09-26: 1000 mL via INTRAVENOUS

## 2018-09-26 MED ORDER — LEVOTHYROXINE SODIUM 100 MCG PO TABS
100.0000 ug | ORAL_TABLET | Freq: Every day | ORAL | Status: DC
  Administered 2018-09-27: 100 ug via ORAL
  Filled 2018-09-26 (×2): qty 1

## 2018-09-26 MED ORDER — INSULIN ASPART PROT & ASPART (70-30 MIX) 100 UNIT/ML ~~LOC~~ SUSP
50.0000 [IU] | Freq: Once | SUBCUTANEOUS | Status: AC
  Administered 2018-09-26: 50 [IU] via SUBCUTANEOUS
  Filled 2018-09-26: qty 10

## 2018-09-26 MED ORDER — POTASSIUM CHLORIDE 10 MEQ/100ML IV SOLN: 10.0000 meq | INTRAVENOUS | Status: DC

## 2018-09-26 MED ORDER — SODIUM CHLORIDE 0.9 % IV SOLN
INTRAVENOUS | Status: AC
  Administered 2018-09-26: 15:00:00 via INTRAVENOUS

## 2018-09-26 MED ORDER — ENOXAPARIN SODIUM 40 MG/0.4ML ~~LOC~~ SOLN: 40.0000 mg | SUBCUTANEOUS | Status: DC

## 2018-09-26 MED ORDER — ENOXAPARIN SODIUM 40 MG/0.4ML ~~LOC~~ SOLN
40.0000 mg | SUBCUTANEOUS | Status: DC
  Administered 2018-09-27: 40 mg via SUBCUTANEOUS
  Filled 2018-09-26: qty 0.4

## 2018-09-26 MED ORDER — INSULIN ASPART 100 UNIT/ML ~~LOC~~ SOLN
0.0000 [IU] | Freq: Three times a day (TID) | SUBCUTANEOUS | Status: DC
  Administered 2018-09-27: 09:00:00 3 [IU] via SUBCUTANEOUS
  Filled 2018-09-26 (×2): qty 1
  Filled 2018-09-26: qty 0.15

## 2018-09-26 MED ORDER — DEXTROSE-NACL 5-0.45 % IV SOLN
INTRAVENOUS | Status: DC
  Administered 2018-09-26: 15:00:00 100 mL/h via INTRAVENOUS

## 2018-09-26 MED ORDER — SODIUM CHLORIDE 0.9 % IV SOLN
INTRAVENOUS | Status: DC
  Administered 2018-09-26: 19:00:00 via INTRAVENOUS

## 2018-09-26 MED ORDER — DEXTROSE-NACL 5-0.45 % IV SOLN: INTRAVENOUS | Status: DC

## 2018-09-26 NOTE — ED Notes (Signed)
PASSWORD: Oxy

## 2018-09-26 NOTE — ED Triage Notes (Signed)
Per family pt has ran out of insulin. Last dose was last night and has missed this am.

## 2018-09-26 NOTE — ED Notes (Signed)
ED TO INPATIENT HANDOFF REPORT  ED Nurse Name and Phone #:  [phone] Larene Beach / Lolly Mustache Name/Age/Gender Francisco Colon 28 y.o. male Room/Bed: ED18A/ED18A  Code Status   Code Status: Full Code  Home/SNF/Other Home Patient oriented to: Self Is this baseline? Yes   Triage Complete: Triage complete  Chief Complaint hyperglycemia  Triage Note Per family pt has ran out of insulin. Last dose was last night and has missed this am.    Allergies No Known Allergies  Level of Care/Admitting Diagnosis ED Disposition    ED Disposition Condition [place]: [place] [100120]  Level of Care: ICU [6]  Covid Evaluation: Asymptomatic Screening Protocol (No Symptoms)  Diagnosis: DKA (diabetic ketoacidoses) [facility]) [[id number]]  Admitting Physician: Otila Back [place]  Attending Physician: Otila Back [[id number]]  Estimated length of stay: past midnight tomorrow  Certification:: I certify this patient will need inpatient services for at least 2 midnights  PT Class (Do Not Modify): Inpatient [101]  PT Acc Code (Do Not Modify): Private [1]       B Medical/Surgery History Past Medical History:  Diagnosis Date  . Autism spectrum disorder   . Hypothyroidism   . Seizure ([place]) 2012   hypoglycemia induced - s/p eval by neuro  . Thyroid nodule    by prior US  . Type 1 diabetes ([place]) 2000   Past Surgical History:  Procedure Laterality Date  . NO PAST SURGERIES       A IV Location/Drains/Wounds Patient Lines/Drains/Airways Status   Active Line/Drains/Airways    Name:   Placement date:   Placement time:   Site:   Days:   Peripheral IV 09/26/18 Left Antecubital   09/26/18    1113    Antecubital   less than 1   Peripheral IV 09/26/18 Left Forearm   09/26/18    1314    Forearm   less than 1          Intake/Output Last 24 hours  Intake/Output Summary (Last 24 hours) at 09/26/2018 1523 Last data filed at 09/26/2018 1455 Gross per 24 hour  Intake  2627.45 ml  Output -  Net 2627.45 ml    Labs/Imaging Results for orders placed or performed during the hospital encounter of 09/26/18 (from the past 48 hour(s))  Glucose, capillary     Status: Abnormal   Collection Time: 09/26/18 10:57 AM  Result Value Ref Range   Glucose-Capillary 406 (H) 70 - 99 mg/dL  Basic metabolic panel     Status: Abnormal   Collection Time: 09/26/18 11:03 AM  Result Value Ref Range   Sodium 142 135 - 145 mmol/L    Comment: LYTES REPEATED DAS   Potassium 4.9 3.5 - 5.1 mmol/L   Chloride 101 98 - 111 mmol/L   CO2 15 (L) 22 - 32 mmol/L   Glucose, Bld 434 (H) 70 - 99 mg/dL   BUN 32 (H) 6 - 20 mg/dL   Creatinine, Ser 1.65 (H) 0.61 - 1.24 mg/dL   Calcium 10.4 (H) 8.9 - 10.3 mg/dL   GFR calc non Af Amer 56 (L) >60 mL/min   GFR calc Af Amer >60 >60 mL/min   Anion gap 26 (H) 5 - 15    Comment: Performed at [facility], [address] [place], [place] [address]  CBC     Status: Abnormal   Collection Time: 09/26/18 11:03 AM  Result Value Ref Range   WBC 20.4 (H) 4.0 -  10.5 K/uL   RBC 6.35 (H) 4.22 - 5.81 MIL/uL   Hemoglobin 16.5 13.0 - 17.0 g/dL   HCT 94.5 85.9 - 29.2 %   MCV 78.0 (L) 80.0 - 100.0 fL   MCH 26.0 26.0 - 34.0 pg   MCHC 33.3 30.0 - 36.0 g/dL   RDW 44.6 28.6 - 38.1 %   Platelets 292 150 - 400 K/uL   nRBC 0.0 0.0 - 0.2 %    Comment: Performed at [facility], [address] [place], [place] [address]  Glucose, capillary     Status: Abnormal   Collection Time: 09/26/18 12:25 PM  Result Value Ref Range   Glucose-Capillary 341 (H) 70 - 99 mg/dL  Blood gas, venous     Status: Abnormal   Collection Time: 09/26/18  1:32 PM  Result Value Ref Range   pH, Ven 7.25 7.250 - 7.430   pCO2, Ven 41 (L) 44.0 - 60.0 mmHg   pO2, Ven 39.0 32.0 - 45.0 mmHg   Bicarbonate 18.0 (L) 20.0 - 28.0 mmol/L   Acid-base deficit 8.8 (H) 0.0 - 2.0 mmol/L   O2 Saturation 63.2 %   Patient temperature 37.0    Collection site VENOUS    Sample type  VENOUS     Comment: Performed at [facility], [address] [place], [place] [address]  Basic metabolic panel     Status: Abnormal   Collection Time: 09/26/18  1:32 PM  Result Value Ref Range   Sodium 142 135 - 145 mmol/L   Potassium 5.0 3.5 - 5.1 mmol/L   Chloride 105 98 - 111 mmol/L   CO2 17 (L) 22 - 32 mmol/L   Glucose, Bld 326 (H) 70 - 99 mg/dL   BUN 28 (H) 6 - 20 mg/dL   Creatinine, Ser 8.33 (H) 0.61 - 1.24 mg/dL   Calcium 9.6 8.9 - 38.3 mg/dL   GFR calc non Af Amer >60 >60 mL/min   GFR calc Af Amer >60 >60 mL/min   Anion gap 20 (H) 5 - 15    Comment: Performed at [facility], [address] [place], [place] [address]  Magnesium     Status: None   Collection Time: 09/26/18  1:32 PM  Result Value Ref Range   Magnesium 2.2 1.7 - 2.4 mg/dL    Comment: Performed at [facility], [address] [place], [place] [address]  Phosphorus     Status: None   Collection Time: 09/26/18  1:32 PM  Result Value Ref Range   Phosphorus 4.2 2.5 - 4.6 mg/dL    Comment: Performed at [facility], [address] [place], [place] [address]  Glucose, capillary     Status: Abnormal   Collection Time: 09/26/18  2:45 PM  Result Value Ref Range   Glucose-Capillary 242 (H) 70 - 99 mg/dL   Dg Abd 1 View  Result Date: 09/26/2018 CLINICAL DATA:  Abdominal pain for 1 day. EXAM: ABDOMEN - 1 VIEW COMPARISON:  None. FINDINGS: The bowel gas pattern is normal. No radio-opaque calculi or other significant radiographic abnormality are seen. IMPRESSION: Normal exam. Electronically Signed   By: Drusilla Kanner M.D.   On: 09/26/2018 14:11   Dg Chest Portable 1 View  Result Date: 09/26/2018 CLINICAL DATA:  Cough.  Hyperglycemia.  History of diabetes. EXAM: PORTABLE CHEST 1 VIEW COMPARISON:  1247 report. FINDINGS: Patient rotated minimally left. Midline trachea. Normal heart size and mediastinal contours. No pleural effusion or pneumothorax. Clear lungs.  IMPRESSION: No  acute cardiopulmonary disease. Electronically Signed   By: Jeronimo GreavesKyle  Talbot M.D.   On: 09/26/2018 12:54    Pending Labs Unresulted Labs (From admission, onward)    Start     Ordered   09/27/18 0500  Basic metabolic panel  Tomorrow morning,   STAT     09/26/18 1322   09/27/18 0500  CBC  Tomorrow morning,   STAT     09/26/18 1322   09/27/18 0500  Magnesium  Tomorrow morning,   STAT     09/26/18 1322   09/27/18 0500  Phosphorus  Tomorrow morning,   STAT     09/26/18 1322   09/27/18 0500  TSH  Tomorrow morning,   STAT     09/26/18 1322   09/26/18 1500  Levetiracetam level  Once,   STAT     09/26/18 1322   09/26/18 1322  Urinalysis, Routine w reflex microscopic  Once,   STAT     09/26/18 1322   09/26/18 1315  HIV antibody (Routine Testing)  Once,   STAT     09/26/18 1320   09/26/18 1315  Basic metabolic panel  STAT Now then every 4 hours ,   STAT     09/26/18 1320   09/26/18 1315  Hemoglobin A1c  Once,   STAT    Comments: To assess prior glycemic control.    09/26/18 1320   09/26/18 1220  SARS CORONAVIRUS 2 (TAT 6-24 HRS) Nasopharyngeal Nasopharyngeal Swab  (Asymptomatic/Tier 2 Patients Labs)  Once,   STAT    Question Answer Comment  Is this test for diagnosis or screening Screening   Symptomatic for COVID-19 as defined by CDC No   Hospitalized for COVID-19 No   Admitted to ICU for COVID-19 No   Previously tested for COVID-19 No   Resident in a congregate (group) care setting No   Employed in healthcare setting No      09/26/18 1219   09/26/18 1217  Urinalysis, Routine w reflex microscopic  ONCE - STAT,   STAT     09/26/18 1218          Vitals/Pain Today's Vitals   09/26/18 1300 09/26/18 1330 09/26/18 1400 09/26/18 1430  BP: (!) 145/70 (!) 145/73 138/70 (!) 145/86  Pulse: 92 91 (!) 114 99  Resp: 10 (!) 25 (!) 22 (!) 29  Temp:      TempSrc:      SpO2: 96% 98% 99% 98%    Isolation Precautions No active isolations  Medications Medications  insulin regular, human  (MYXREDLIN) 100 units/ 100 mL infusion (1.8 Units/hr Intravenous Rate/Dose Change 09/26/18 1449)  lactated ringers infusion ( Intravenous New Bag/Given 09/26/18 1316)  levothyroxine (SYNTHROID) tablet 100 mcg (has no administration in time range)  levETIRAcetam (KEPPRA) tablet 500 mg (has no administration in time range)  0.9 %  sodium chloride infusion ( Intravenous New Bag/Given 09/26/18 1454)  0.9 %  sodium chloride infusion (has no administration in time range)  dextrose 5 %-0.45 % sodium chloride infusion (100 mL/hr Intravenous New Bag/Given 09/26/18 1453)  enoxaparin (LOVENOX) injection 40 mg (has no administration in time range)  insulin aspart protamine- aspart (NOVOLOG MIX 70/30) injection 50 Units (50 Units Subcutaneous Given 09/26/18 1114)  sodium chloride 0.9 % bolus 1,000 mL (0 mLs Intravenous Stopped 09/26/18 1404)  potassium chloride 10 mEq in 100 mL IVPB ( Intravenous Stopped 09/26/18 1450)  lactated ringers bolus 1,000 mL (0 mLs Intravenous Stopped 09/26/18 1407)    Mobility  walks Low fall risk   Focused Assessments    R Recommendations: See Admitting Provider Note  Report given to:   Additional Notes: Pt has Legal Guardian - Cleophus MoltDavid Spruill (uncle) Phone number is [phone]. Uncle wants password to be "Oxy"

## 2018-09-26 NOTE — ED Notes (Signed)
Pt content watching tv; lights dimmed again for pt; pt denies any needs; rails up; bed locked low; call bell within reach.

## 2018-09-26 NOTE — ED Provider Notes (Addendum)
[facility] Emergency Department Provider Note   ____________________________________________   First MD Initiated Contact with Patient 09/26/18 1201     (approximate)  I have reviewed the triage vital signs and the nursing notes.   HISTORY  Chief Complaint Hyperglycemia   HPI Francisco Colon is a 28 y.o. male with past medical history of type 1 diabetes, seizure disorder, and autism spectrum disorder who presents to the ED for hyperglycemia.  History is limited from patient given his autistic spectrum disorder, uncle is primary caretaker and provides majority of the history.  Uncle states that patient has been running low on insulin recently and they have had difficulty affording a refill.  Medical states that had to ration patient's insulin yesterday and ran out this morning.  He typically takes 70/30 insulin twice daily and has not had any insulin since last night.  Francisco Colon states that patient has begun complaining of abdominal pain and has vomited twice, with nonbloody and nonbilious emesis.  Patient currently denies any complaints, including pain.  He has not had any fevers, cough, chest pain, or shortness of breath.        Past Medical History:  Diagnosis Date  . Autism spectrum disorder   . Hypothyroidism   . Seizure (HCC) 2012   hypoglycemia induced - s/p eval by neuro  . Thyroid nodule    by prior US  . Type 1 diabetes (HCC) 2000    Patient Active Problem List   Diagnosis Date Noted  . Type 1 diabetes (HCC)   . Hypothyroidism (acquired)   . Seizure disorder (HCC)   . Autism spectrum disorder   . Type 1 diabetes mellitus (HCC) 07/27/2011  . Active autistic disorder 04/27/2011  . Acquired hypothyroidism 04/27/2011  . Seizure (HCC) 04/27/2011  . Sickle cell trait (HCC) 04/27/2011    Past Surgical History:  Procedure Laterality Date  . NO PAST SURGERIES      Prior to Admission medications   Medication Sig Start Date End Date Taking?  Authorizing Provider  Glucagon 1 MG/0.2ML SOAJ Inject 1 mg into the skin as needed.     [provider]  Insulin Isophane & Regular Human (HUMULIN 70/30 KWIKPEN) (70-30) 100 UNIT/ML PEN Inject 50 units before breakfast, and 30 units before dinner 03/27/17   [provider]  Insulin Pen Needle (FIFTY50 PEN NEEDLES) 31G X 5 MM MISC Use as directed twice daily for novolog 70/30. 11/16/13   [provider]  levETIRAcetam (KEPPRA) 500 MG tablet Take 500 mg by mouth daily.     [provider]  levothyroxine (SYNTHROID) 100 MCG tablet Take 100 mcg by mouth daily before breakfast.    [provider]  levothyroxine (SYNTHROID, LEVOTHROID) 75 MCG tablet  01/20/16   [provider]  levothyroxine (SYNTHROID, LEVOTHROID) 75 MCG tablet Take by mouth. 08/13/15   [provider]  MONOJECT ULTRA COMFORT SYRINGE 29G X 1/2" 1 ML MISC  01/07/16   [provider]  NOVOLOG FLEXPEN 100 UNIT/ML FlexPen  01/08/16   [provider]  NOVOLOG MIX 70/30 (70-30) 100 UNIT/ML injection Inject 22 Units into the skin 2 (two) times daily before a meal. 22 units ac breakfast and 30 units ac supper 02/29/16   [provider]    Allergies Patient has no known allergies.  Family History  Problem Relation Age of Onset  . Hypertension Maternal Grandmother   . Diabetes Brother   . Cancer Mother  lung, deceased    Social History Social History   Tobacco Use  . Smoking status: Never Smoker  . Smokeless tobacco: Never Used  Substance Use Topics  . Alcohol use: No  . Drug use: No    Review of Systems  Constitutional: No fever/chills Eyes: No visual changes. ENT: No sore throat. Cardiovascular: Denies chest pain. Respiratory: Denies shortness of breath. Gastrointestinal: Positive for abdominal pain, nausea, and vomiting.  No diarrhea.  No constipation. Genitourinary: Negative for dysuria. Musculoskeletal: Negative for back pain.  Skin: Negative for rash. Neurological: Negative for headaches, focal weakness or numbness.  ____________________________________________   PHYSICAL EXAM:  VITAL SIGNS: ED Triage Vitals [09/26/18 1059]  Enc Vitals Group     BP 131/74     Pulse Rate (!) 103     Resp 18     Temp 98.4 F (36.9 C)     Temp Source Oral     SpO2 97 %     Weight      Height      Head Circumference      Peak Flow      Pain Score      Pain Loc      Pain Edu?      Excl. in [place]?     Constitutional: Alert and oriented. Eyes: Conjunctivae are normal. Head: Atraumatic. Nose: No congestion/rhinnorhea. Mouth/Throat: Mucous membranes are moist. Neck: Normal ROM Cardiovascular: Normal rate, regular rhythm. Grossly normal heart sounds. Respiratory: Normal respiratory effort.  No retractions. Lungs CTAB. Gastrointestinal: Soft and nontender. No distention. Genitourinary: deferred Musculoskeletal: No lower extremity tenderness nor edema. Neurologic:  Normal speech and language. No gross focal neurologic deficits are appreciated. Skin:  Skin is warm, dry and intact. No rash noted. Psychiatric: Mood and affect are normal. Speech and behavior are normal.  ____________________________________________   LABS (all labs ordered are listed, but only abnormal results are displayed)  Labs Reviewed  GLUCOSE, CAPILLARY - Abnormal; Notable for the following components:      Result Value   Glucose-Capillary 406 (*)    All other components within normal limits  BASIC METABOLIC PANEL - Abnormal; Notable for the following components:   CO2 15 (*)    Glucose, Bld 434 (*)    BUN 32 (*)    Creatinine, Ser 1.65 (*)    Calcium 10.4 (*)    GFR calc non Af Amer 56 (*)    Anion gap 26 (*)    All other components within normal limits  CBC - Abnormal; Notable for the following components:   WBC 20.4 (*)    RBC 6.35 (*)    MCV 78.0 (*)    All other components within normal limits  GLUCOSE, CAPILLARY - Abnormal;  Notable for the following components:   Glucose-Capillary 341 (*)    All other components within normal limits  SARS CORONAVIRUS 2 (TAT 6-24 HRS)  URINALYSIS, COMPLETE (UACMP) WITH MICROSCOPIC  BLOOD GAS, VENOUS  URINALYSIS, ROUTINE W REFLEX MICROSCOPIC  CBG MONITORING, ED     PROCEDURES  Procedure(s) performed (including Critical Care):  .Critical Care Performed by: Blake Divine, MD Authorized by: Blake Divine, MD   Critical care provider statement:    Critical care time (minutes):  45   Critical care time was exclusive of:  Separately billable procedures and treating other patients and teaching time   Critical care was necessary to treat or prevent imminent or life-threatening deterioration of the following conditions:  Endocrine crisis   Critical care was time  spent personally by me on the following activities:  Discussions with consultants, evaluation of patient's response to treatment, examination of patient, ordering and performing treatments and interventions, ordering and review of laboratory studies, ordering and review of radiographic studies, pulse oximetry, re-evaluation of patient's condition, obtaining history from patient or surrogate and review of old charts   I assumed direction of critical care for this patient from another provider in my specialty: no       ____________________________________________   INITIAL IMPRESSION / ASSESSMENT AND PLAN / ED COURSE       28 year old male with history of type 1 diabetes, autism, and seizure disorder presents to the ED after running out of insulin, noted to be hyperglycemic at home.  He had also had abdominal pain with a couple episodes of vomiting at home.  It appears that family has been rationing insulin recently due to cost and now patient's blood work is consistent with DKA given his increased anion gap and acidosis.  We will continue hydration with second IV fluid bolus, patient given dose of insulin in triage  with slight improvement in hyperglycemia, will now need to be started on insulin drip.  Potassium within normal limits, will give some potassium with insulin.  Patient does have a leukocytosis, however this is likely secondary to DKA as he has no infectious symptoms.  His abdominal exam is reassuring, chest x-ray clear by my read.  COVID testing and UA are pending, but low suspicion for either to reveal source of infection.  Case discussed with hospitalist, who accepts patient for admission.      ____________________________________________   FINAL CLINICAL IMPRESSION(S) / ED DIAGNOSES  Final diagnoses:  Diabetic ketoacidosis without coma associated with type 1 diabetes mellitus (HCC)  Non-intractable vomiting with nausea, unspecified vomiting type     ED Discharge Orders    None       Note:  This document was prepared using Dragon voice recognition software and may include unintentional dictation errors.   Chesley Noon, MD 09/26/18 1247    Chesley Noon, MD 10/08/18 [phone]

## 2018-09-26 NOTE — ED Notes (Signed)
Provider notified via direct message/page that pt meets criteria to have insulin gtt d/c. Requested orders per DKA sidebar.

## 2018-09-26 NOTE — ED Notes (Signed)
Provide Francisco Colon verbal that he will place orders soon.

## 2018-09-26 NOTE — ED Notes (Signed)
Spoke with MD Jimmye Norman , see orders

## 2018-09-26 NOTE — ED Notes (Signed)
Pt reporting he does not fell like he has to urinate. Urinal at bedside and pt aware a urine sample is needed.

## 2018-09-26 NOTE — H&P (Signed)
[employer] [facility] [facility] [place] [facility]   PATIENT NAME: Francisco Colon    MR#:  [id number]  DATE OF BIRTH:  22-Aug-1990  DATE OF ADMISSION:  09/26/2018  PRIMARY CARE PHYSICIAN: [facility]   REQUESTING/REFERRING PHYSICIAN: Chesley NoonJessup, Charles  CHIEF COMPLAINT:  Nausea and vomiting and abdominal discomfort  HISTORY OF PRESENT ILLNESS:  Francisco Colon  is a 28 y.o. male with a known history of type 1 diabetes mellitus, seizure disorder and autism spectrum disorder who presented to the emergency room with nausea and vomiting and some abdominal discomfort.  History was obtained from emergency room provider and from patient's uncle who is the patient's guardian.  Patient apparently said to have been running low on insulin recently and family had difficulty affording a refill.  There was no blood in the vomitus.  No diarrhea.  No chest pain.  No shortness of breath.  Patient evaluated in the emergency room and found to be in DKA with a blood sugar of 434 with anion gap of 26.  Started on IV fluids almost getting 2 L already.  Also started on insulin drip.  Patient with leukocytosis with white count of 20,000.  No fevers.  Urinalysis requested.  Chest x-ray negative.  Abdominal KUB requested.  Medical service called to admit patient for further evaluation and management.  PAST MEDICAL HISTORY:   Past Medical History:  Diagnosis Date  . Autism spectrum disorder   . Hypothyroidism   . Seizure (HCC) 2012   hypoglycemia induced - s/p eval by neuro  . Thyroid nodule    by prior US  . Type 1 diabetes (HCC) 2000    PAST SURGICAL HISTORY:   Past Surgical History:  Procedure Laterality Date  . NO PAST SURGERIES      SOCIAL HISTORY:   Social History   Tobacco Use  . Smoking status: Never Smoker  . Smokeless tobacco: Never Used  Substance Use Topics  . Alcohol use: No    FAMILY HISTORY:   Family History  Problem Relation Age of Onset  .  Hypertension Maternal Grandmother   . Diabetes Brother   . Cancer Mother        lung, deceased    DRUG ALLERGIES:  No Known Allergies  REVIEW OF SYSTEMS:   ROS Unobtainable due to patient's underlying medical condition.  Poor historian  MEDICATIONS AT HOME:   Prior to Admission medications   Medication Sig Start Date End Date Taking? Authorizing Provider  Glucagon 1 MG/0.2ML SOAJ Inject 1 mg into the skin as needed (hypoglycemic emergency).    Yes [provider]  levETIRAcetam (KEPPRA) 500 MG tablet Take 500 mg by mouth 2 (two) times daily.    Yes [provider]  levothyroxine (SYNTHROID) 100 MCG tablet Take 100 mcg by mouth daily before breakfast.   Yes [provider]  NOVOLOG MIX 70/30 (70-30) 100 UNIT/ML injection Inject 22-30 Units into the skin See admin instructions. Inject 22u under the skin every morning and inject 30u under the skin every evening before meals   Yes [provider]      VITAL SIGNS:  Blood pressure (!) 145/70, pulse 92, temperature 98.4 F (36.9 C), temperature source Oral, resp. rate 10, SpO2 96 %.  PHYSICAL EXAMINATION:  Physical Exam  GENERAL:  28 y.o.-year-old patient lying in the bed with no acute distress.  EYES: Pupils equal, round, reactive to light and accommodation. No scleral icterus. Extraocular muscles intact.  HEENT: Head atraumatic,  normocephalic.  Dry oral mucosa .  NECK:  Supple, no jugular venous distention. No thyroid enlargement, no tenderness.  LUNGS: Normal breath sounds bilaterally, no wheezing, rales,rhonchi or crepitation. No use of accessory muscles of respiration.  CARDIOVASCULAR: S1, S2 normal. No murmurs, rubs, or gallops.  ABDOMEN: Soft, nontender, nondistended. Bowel sounds present. No organomegaly or mass.  EXTREMITIES: No pedal edema, cyanosis, or clubbing.  NEUROLOGIC: Cranial nerves II through XII are intact. Muscle strength 5/5 in all extremities. Sensation intact. Gait not  checked.  PSYCHIATRIC: The patient is alert and oriented x 3.  SKIN: No obvious rash, lesion, or ulcer.   LABORATORY PANEL:   CBC Recent Labs  Lab 09/26/18 1103  WBC 20.4*  HGB 16.5  HCT 49.5  PLT 292   ------------------------------------------------------------------------------------------------------------------  Chemistries  Recent Labs  Lab 09/26/18 1103  NA 142  K 4.9  CL 101  CO2 15*  GLUCOSE 434*  BUN 32*  CREATININE 1.65*  CALCIUM 10.4*   ------------------------------------------------------------------------------------------------------------------  Cardiac Enzymes No results for input(s): TROPONINI in the last 168 hours. ------------------------------------------------------------------------------------------------------------------  RADIOLOGY:  Dg Chest Portable 1 View  Result Date: 09/26/2018 CLINICAL DATA:  Cough.  Hyperglycemia.  History of diabetes. EXAM: PORTABLE CHEST 1 VIEW COMPARISON:  1247 report. FINDINGS: Patient rotated minimally left. Midline trachea. Normal heart size and mediastinal contours. No pleural effusion or pneumothorax. Clear lungs. IMPRESSION: No acute cardiopulmonary disease. Electronically Signed   By: Abigail Miyamoto M.D.   On: 09/26/2018 12:54      IMPRESSION AND PLAN:  Patient is a 28 year old male with history of type 1 diabetes mellitus, seizure disorder on autism spectrum disorder admitted with DKA.  1.  DKA. Presented with blood sugar of 434 with anion gap of 26.  Patient had been running low on insulin and finally ran out of insulin Patient awake and alert and pleasantly confused due to underlying autism spectrum disorder. Started on insulin drip and IV fluids.  Been admitted to ICU. Follow-up on serial BMP to monitor her anion gap.  Wean off insulin drip once anion gap is corrected.  2.  Leukocytosis with white count of 20,000 Likely reactive.  So far no evidence of infectious process.  No fevers. Chest x-ray  negative.  Follow-up on urinalysis when done. Due to nonspecific mild abdominal discomfort requested for abdominal KUB.  3.  History of seizure disorder Stable.  Resumed home dose of Keppra.  Requested for Keppra level.  4.  History of autism spectrum disorder Stable at this time.  DVT prophylaxis; Lovenox   All the records are reviewed and case discussed with ED provider. Management plans discussed with the patient, family and they are in agreement. Discussed with patient's uncle Mr. Spruill, Shanon Brow who is the patient's guardian on treatment plans as outlined above.  All questions were answered and he is in agreement to the plan of care as outlined.  He confirmed patient's CODE STATUS to be full code  CODE STATUS: Full code  TOTAL TIME TAKING CARE OF THIS PATIENT: 61 minutes.    Teresia Myint M.D on 09/26/2018 at 1:22 PM  Between 7am to 6pm - Pager - [phone]  After 6pm go to [email or web address] - Technical brewer [facility]  Office  [phone]  CC: Primary care physician; [facility] [place]   Note: This dictation was prepared with Diplomatic Services operational officer dictation along with smaller phrase technology. Any transcriptional errors that result from this process are unintentional.

## 2018-09-26 NOTE — ED Notes (Addendum)
First nurse note: Pt here with family, pt has hx of autism. States pt glucose  read 300 at home. Pt in NAD

## 2018-09-27 ENCOUNTER — Other Ambulatory Visit: Payer: Self-pay

## 2018-09-27 DIAGNOSIS — R109 Unspecified abdominal pain: Secondary | ICD-10-CM | POA: Diagnosis not present

## 2018-09-27 DIAGNOSIS — E1010 Type 1 diabetes mellitus with ketoacidosis without coma: Secondary | ICD-10-CM | POA: Diagnosis not present

## 2018-09-27 LAB — GLUCOSE, CAPILLARY
Glucose-Capillary: 114 mg/dL — ABNORMAL HIGH (ref 70–99)
Glucose-Capillary: 123 mg/dL — ABNORMAL HIGH (ref 70–99)
Glucose-Capillary: 124 mg/dL — ABNORMAL HIGH (ref 70–99)
Glucose-Capillary: 173 mg/dL — ABNORMAL HIGH (ref 70–99)
Glucose-Capillary: 306 mg/dL — ABNORMAL HIGH (ref 70–99)

## 2018-09-27 LAB — BASIC METABOLIC PANEL
Anion gap: 10 (ref 5–15)
Anion gap: 8 (ref 5–15)
BUN: 18 mg/dL (ref 6–20)
BUN: 19 mg/dL (ref 6–20)
CO2: 23 mmol/L (ref 22–32)
CO2: 24 mmol/L (ref 22–32)
Calcium: 8.8 mg/dL — ABNORMAL LOW (ref 8.9–10.3)
Calcium: 8.9 mg/dL (ref 8.9–10.3)
Chloride: 110 mmol/L (ref 98–111)
Chloride: 113 mmol/L — ABNORMAL HIGH (ref 98–111)
Creatinine, Ser: 0.95 mg/dL (ref 0.61–1.24)
Creatinine, Ser: 0.99 mg/dL (ref 0.61–1.24)
GFR calc Af Amer: >60 mL/min (ref >60)
GFR calc Af Amer: >60 mL/min (ref >60)
GFR calc non Af Amer: >60 mL/min (ref >60)
GFR calc non Af Amer: >60 mL/min (ref >60)
Glucose, Bld: 123 mg/dL — ABNORMAL HIGH (ref 70–99)
Glucose, Bld: 160 mg/dL — ABNORMAL HIGH (ref 70–99)
Potassium: 3.9 mmol/L (ref 3.5–5.1)
Potassium: 4 mmol/L (ref 3.5–5.1)
Sodium: 144 mmol/L (ref 135–145)
Sodium: 144 mmol/L (ref 135–145)

## 2018-09-27 LAB — CBC
HCT: 38.4 % — ABNORMAL LOW (ref 39.0–52.0)
Hemoglobin: 12.6 g/dL — ABNORMAL LOW (ref 13.0–17.0)
MCH: 25.7 pg — ABNORMAL LOW (ref 26.0–34.0)
MCHC: 32.8 g/dL (ref 30.0–36.0)
MCV: 78.2 fL — ABNORMAL LOW (ref 80.0–100.0)
Platelets: 211 10*3/uL (ref 150–400)
RBC: 4.91 MIL/uL (ref 4.22–5.81)
RDW: 12.4 % (ref 11.5–15.5)
WBC: 11.4 10*3/uL — ABNORMAL HIGH (ref 4.0–10.5)
nRBC: 0 % (ref 0.0–0.2)

## 2018-09-27 LAB — HIV ANTIBODY (ROUTINE TESTING W REFLEX): HIV Screen 4th Generation wRfx: NONREACTIVE

## 2018-09-27 LAB — TSH: TSH: 10.044 u[IU]/mL — ABNORMAL HIGH (ref 0.350–4.500)

## 2018-09-27 LAB — SARS CORONAVIRUS 2 (TAT 6-24 HRS): SARS Coronavirus 2: NEGATIVE

## 2018-09-27 LAB — PHOSPHORUS: Phosphorus: 2.1 mg/dL — ABNORMAL LOW (ref 2.5–4.6)

## 2018-09-27 LAB — MAGNESIUM: Magnesium: 1.7 mg/dL (ref 1.7–2.4)

## 2018-09-27 MED ORDER — LEVOTHYROXINE SODIUM 125 MCG PO TABS: 125.0000 ug | ORAL_TABLET | Freq: Every day | ORAL | 0 refills | Status: DC

## 2018-09-27 MED ORDER — LEVOTHYROXINE SODIUM 25 MCG PO TABS: 25.0000 ug | ORAL_TABLET | Freq: Once | ORAL | Status: DC

## 2018-09-27 MED ORDER — LEVOTHYROXINE SODIUM 25 MCG PO TABS: 125.0000 ug | ORAL_TABLET | Freq: Every day | ORAL | Status: DC

## 2018-09-27 NOTE — Plan of Care (Signed)
  Problem: Health Behavior/Discharge Planning: Goal: Ability to manage health-related needs will improve Outcome: Adequate for Discharge   Problem: Clinical Measurements: Goal: Ability to maintain clinical measurements within normal limits will improve Outcome: Adequate for Discharge Goal: Will remain free from infection Outcome: Adequate for Discharge Goal: Respiratory complications will improve Outcome: Adequate for Discharge   Problem: Nutrition: Goal: Adequate nutrition will be maintained Outcome: Adequate for Discharge   Problem: Coping: Goal: Level of anxiety will decrease Outcome: Adequate for Discharge   Problem: Elimination: Goal: Will not experience complications related to bowel motility Outcome: Adequate for Discharge Goal: Will not experience complications related to urinary retention Outcome: Adequate for Discharge   Problem: Pain Managment: Goal: General experience of comfort will improve Outcome: Adequate for Discharge   Problem: Skin Integrity: Goal: Risk for impaired skin integrity will decrease Outcome: Adequate for Discharge

## 2018-09-27 NOTE — Plan of Care (Signed)
  Problem: Health Behavior/Discharge Planning: Goal: Ability to manage health-related needs will improve Outcome: Progressing   Problem: Clinical Measurements: Goal: Ability to maintain clinical measurements within normal limits will improve Outcome: Progressing Goal: Will remain free from infection Outcome: Progressing Goal: Respiratory complications will improve Outcome: Progressing   Problem: Nutrition: Goal: Adequate nutrition will be maintained Outcome: Progressing   Problem: Coping: Goal: Level of anxiety will decrease Outcome: Progressing   Problem: Elimination: Goal: Will not experience complications related to bowel motility Outcome: Progressing Goal: Will not experience complications related to urinary retention Outcome: Progressing   Problem: Pain Managment: Goal: General experience of comfort will improve Outcome: Progressing   Problem: Skin Integrity: Goal: Risk for impaired skin integrity will decrease Outcome: Progressing

## 2018-09-27 NOTE — ED Notes (Signed)
ED TO INPATIENT HANDOFF REPORT  ED Nurse Name and Phone #: Wells Guiles 30  S Name/Age/Gender Francisco Colon 28 y.o. male Room/Bed: ED34A/ED34A  Code Status   Code Status: Full Code  Home/SNF/Other Home Patient oriented to: self, place, time and situation Is this baseline? Yes   Triage Complete: Triage complete  Chief Complaint hyperglycemia  Triage Note Per family pt has ran out of insulin. Last dose was last night and has missed this am.    Allergies No Known Allergies  Level of Care/Admitting Diagnosis ED Disposition    ED Disposition Condition [place]: [place] [[id number]]  Level of Care: Med-Surg [16]  Covid Evaluation: Asymptomatic Screening Protocol (No Symptoms)  Diagnosis: DKA (diabetic ketoacidoses) [facility]) [[id number]]  Admitting Physician: Otila Back Bearden  Attending Physician: Otila Back [[id number]]  Estimated length of stay: past midnight tomorrow  Certification:: I certify this patient will need inpatient services for at least 2 midnights  PT Class (Do Not Modify): Inpatient [101]  PT Acc Code (Do Not Modify): Private [1]       B Medical/Surgery History Past Medical History:  Diagnosis Date  . Autism spectrum disorder   . Hypothyroidism   . Seizure ([place]) 2012   hypoglycemia induced - s/p eval by neuro  . Thyroid nodule    by prior US  . Type 1 diabetes ([place]) 2000   Past Surgical History:  Procedure Laterality Date  . NO PAST SURGERIES       A IV Location/Drains/Wounds Patient Lines/Drains/Airways Status   Active Line/Drains/Airways    Name:   Placement date:   Placement time:   Site:   Days:   Peripheral IV 09/26/18 Left Antecubital   09/26/18    1113    Antecubital   1   Peripheral IV 09/26/18 Left Forearm   09/26/18    1314    Forearm   1          Intake/Output Last 24 hours  Intake/Output Summary (Last 24 hours) at 09/27/2018 0522 Last data filed at 09/26/2018 1818 Gross per 24 hour  Intake  4627.45 ml  Output -  Net 4627.45 ml    Labs/Imaging Results for orders placed or performed during the hospital encounter of 09/26/18 (from the past 48 hour(s))  Glucose, capillary     Status: Abnormal   Collection Time: 09/26/18 10:57 AM  Result Value Ref Range   Glucose-Capillary 406 (H) 70 - 99 mg/dL  Basic metabolic panel     Status: Abnormal   Collection Time: 09/26/18 11:03 AM  Result Value Ref Range   Sodium 142 135 - 145 mmol/L    Comment: LYTES REPEATED DAS   Potassium 4.9 3.5 - 5.1 mmol/L   Chloride 101 98 - 111 mmol/L   CO2 15 (L) 22 - 32 mmol/L   Glucose, Bld 434 (H) 70 - 99 mg/dL   BUN 32 (H) 6 - 20 mg/dL   Creatinine, Ser 1.65 (H) 0.61 - 1.24 mg/dL   Calcium 10.4 (H) 8.9 - 10.3 mg/dL   GFR calc non Af Amer 56 (L) >60 mL/min   GFR calc Af Amer >60 >60 mL/min   Anion gap 26 (H) 5 - 15    Comment: Performed at [facility], [address] [place], [place] [address]  CBC     Status: Abnormal   Collection Time: 09/26/18 11:03 AM  Result Value Ref Range   WBC 20.4 (H) 4.0 - 10.5 K/uL  RBC 6.35 (H) 4.22 - 5.81 MIL/uL   Hemoglobin 16.5 13.0 - 17.0 g/dL   HCT 40.949.5 81.139.0 - 91.452.0 %   MCV 78.0 (L) 80.0 - 100.0 fL   MCH 26.0 26.0 - 34.0 pg   MCHC 33.3 30.0 - 36.0 g/dL   RDW 78.212.2 95.611.5 - 21.315.5 %   Platelets 292 150 - 400 K/uL   nRBC 0.0 0.0 - 0.2 %    Comment: Performed at [facility], [address] [place], [place] [address]  Glucose, capillary     Status: Abnormal   Collection Time: 09/26/18 12:25 PM  Result Value Ref Range   Glucose-Capillary 341 (H) 70 - 99 mg/dL  Blood gas, venous     Status: Abnormal   Collection Time: 09/26/18  1:32 PM  Result Value Ref Range   pH, Ven 7.25 7.250 - 7.430   pCO2, Ven 41 (L) 44.0 - 60.0 mmHg   pO2, Ven 39.0 32.0 - 45.0 mmHg   Bicarbonate 18.0 (L) 20.0 - 28.0 mmol/L   Acid-base deficit 8.8 (H) 0.0 - 2.0 mmol/L   O2 Saturation 63.2 %   Patient temperature 37.0    Collection site VENOUS    Sample type  VENOUS     Comment: Performed at [facility], [address] [place], [place] [address]  SARS CORONAVIRUS 2 (TAT 6-24 HRS) Nasopharyngeal Nasopharyngeal Swab     Status: None   Collection Time: 09/26/18  1:32 PM   Specimen: Nasopharyngeal Swab  Result Value Ref Range   SARS Coronavirus 2 NEGATIVE NEGATIVE    Comment: (NOTE) SARS-CoV-2 target nucleic acids are NOT DETECTED. The SARS-CoV-2 RNA is generally detectable in upper and lower respiratory specimens during the acute phase of infection. Negative results do not preclude SARS-CoV-2 infection, do not rule out co-infections with other pathogens, and should not be used as the sole basis for treatment or other patient management decisions. Negative results must be combined with clinical observations, patient history, and epidemiological information. The expected result is Negative. Fact Sheet for Patients: HairSlick.nohttps://www.fda.gov/media/138098/download Fact Sheet for Healthcare Providers: quierodirigir.comhttps://www.fda.gov/media/138095/download This test is not yet approved or cleared by the [place] FDA and  has been authorized for detection and/or diagnosis of SARS-CoV-2 by FDA under an Emergency Use Authorization (EUA). This EUA will remain  in effect (meaning this test can be used) for the duration of the COVID-19 declaration under Section 56 4(b)(1) of the Act, 21 U.S.C. section 360bbb-3(b)(1), unless the authorization is terminated or revoked sooner. Performed at [facility], [address] [place], [place] [address]   HIV antibody (Routine Testing)     Status: None   Collection Time: 09/26/18  1:32 PM  Result Value Ref Range   HIV Screen 4th Generation wRfx Non Reactive Non Reactive    Comment: (NOTE) Performed At: [facility] [address] [place], [place] [id number] Jolene SchimkeNagendra Sanjai MD UV:[id number]:[phone]   Basic metabolic panel     Status: Abnormal   Collection Time: 09/26/18  1:32 PM  Result Value Ref Range    Sodium 142 135 - 145 mmol/L   Potassium 5.0 3.5 - 5.1 mmol/L   Chloride 105 98 - 111 mmol/L   CO2 17 (L) 22 - 32 mmol/L   Glucose, Bld 326 (H) 70 - 99 mg/dL   BUN 28 (H) 6 - 20 mg/dL   Creatinine, Ser 7.421.43 (H) 0.61 - 1.24 mg/dL   Calcium 9.6 8.9 - 59.510.3 mg/dL   GFR calc non Af  Amer >60 >60 mL/min   GFR calc Af Amer >60 >60 mL/min   Anion gap 20 (H) 5 - 15    Comment: Performed at [facility], [address] [place], [place] [address]  Hemoglobin A1c     Status: Abnormal   Collection Time: 09/26/18  1:32 PM  Result Value Ref Range   Hgb A1c MFr Bld 8.9 (H) 4.8 - 5.6 %    Comment: (NOTE) Pre diabetes:          5.7%-6.4% Diabetes:              >6.4% Glycemic control for   <7.0% adults with diabetes    Mean Plasma Glucose 208.73 mg/dL    Comment: Performed at [facility], [address], [place], [place] [address]  Magnesium     Status: None   Collection Time: 09/26/18  1:32 PM  Result Value Ref Range   Magnesium 2.2 1.7 - 2.4 mg/dL    Comment: Performed at [facility], [address] [place], [place] [address]  Phosphorus     Status: None   Collection Time: 09/26/18  1:32 PM  Result Value Ref Range   Phosphorus 4.2 2.5 - 4.6 mg/dL    Comment: Performed at [facility], [address] [place], [place] [address]  Glucose, capillary     Status: Abnormal   Collection Time: 09/26/18  2:45 PM  Result Value Ref Range   Glucose-Capillary 242 (H) 70 - 99 mg/dL  Glucose, capillary     Status: Abnormal   Collection Time: 09/26/18  3:55 PM  Result Value Ref Range   Glucose-Capillary 219 (H) 70 - 99 mg/dL  Glucose, capillary     Status: Abnormal   Collection Time: 09/26/18  4:58 PM  Result Value Ref Range   Glucose-Capillary 216 (H) 70 - 99 mg/dL  Glucose, capillary     Status: Abnormal   Collection Time: 09/26/18  6:04 PM  Result Value Ref Range   Glucose-Capillary 202 (H) 70 - 99 mg/dL  Basic metabolic panel     Status: Abnormal    Collection Time: 09/26/18  6:21 PM  Result Value Ref Range   Sodium 144 135 - 145 mmol/L   Potassium 4.1 3.5 - 5.1 mmol/L   Chloride 113 (H) 98 - 111 mmol/L   CO2 20 (L) 22 - 32 mmol/L   Glucose, Bld 194 (H) 70 - 99 mg/dL   BUN 21 (H) 6 - 20 mg/dL   Creatinine, Ser 6.57 0.61 - 1.24 mg/dL   Calcium 9.2 8.9 - 84.6 mg/dL   GFR calc non Af Amer >60 >60 mL/min   GFR calc Af Amer >60 >60 mL/min   Anion gap 11 5 - 15    Comment: Performed at [facility], [address] [place], [place] [address]  Glucose, capillary     Status: Abnormal   Collection Time: 09/26/18  7:19 PM  Result Value Ref Range   Glucose-Capillary 153 (H) 70 - 99 mg/dL  Glucose, capillary     Status: Abnormal   Collection Time: 09/26/18  8:48 PM  Result Value Ref Range   Glucose-Capillary 133 (H) 70 - 99 mg/dL  Glucose, capillary     Status: Abnormal   Collection Time: 09/26/18  9:50 PM  Result Value Ref Range   Glucose-Capillary 129 (H) 70 - 99 mg/dL  Glucose, capillary     Status: Abnormal   Collection Time: 09/26/18 10:58 PM  Result  Value Ref Range   Glucose-Capillary 106 (H) 70 - 99 mg/dL  Glucose, capillary     Status: Abnormal   Collection Time: 09/27/18 12:03 AM  Result Value Ref Range   Glucose-Capillary 123 (H) 70 - 99 mg/dL  Basic metabolic panel     Status: Abnormal   Collection Time: 09/27/18 12:11 AM  Result Value Ref Range   Sodium 144 135 - 145 mmol/L   Potassium 3.9 3.5 - 5.1 mmol/L   Chloride 113 (H) 98 - 111 mmol/L   CO2 23 22 - 32 mmol/L   Glucose, Bld 123 (H) 70 - 99 mg/dL   BUN 19 6 - 20 mg/dL   Creatinine, Ser 1.610.99 0.61 - 1.24 mg/dL   Calcium 8.8 (L) 8.9 - 10.3 mg/dL   GFR calc non Af Amer >60 >60 mL/min   GFR calc Af Amer >60 >60 mL/min   Anion gap 8 5 - 15    Comment: Performed at [facility], [address] [place], [place] [address]  Glucose, capillary     Status: Abnormal   Collection Time: 09/27/18 12:59 AM  Result Value Ref Range   Glucose-Capillary  114 (H) 70 - 99 mg/dL  Glucose, capillary     Status: Abnormal   Collection Time: 09/27/18  2:09 AM  Result Value Ref Range   Glucose-Capillary 124 (H) 70 - 99 mg/dL   Dg Abd 1 View  Result Date: 09/26/2018 CLINICAL DATA:  Abdominal pain for 1 day. EXAM: ABDOMEN - 1 VIEW COMPARISON:  None. FINDINGS: The bowel gas pattern is normal. No radio-opaque calculi or other significant radiographic abnormality are seen. IMPRESSION: Normal exam. Electronically Signed   By: Drusilla Kannerhomas  Dalessio M.D.   On: 09/26/2018 14:11   Dg Chest Portable 1 View  Result Date: 09/26/2018 CLINICAL DATA:  Cough.  Hyperglycemia.  History of diabetes. EXAM: PORTABLE CHEST 1 VIEW COMPARISON:  1247 report. FINDINGS: Patient rotated minimally left. Midline trachea. Normal heart size and mediastinal contours. No pleural effusion or pneumothorax. Clear lungs. IMPRESSION: No acute cardiopulmonary disease. Electronically Signed   By: Jeronimo GreavesKyle  Talbot M.D.   On: 09/26/2018 12:54    Pending Labs Unresulted Labs (From admission, onward)    Start     Ordered   09/27/18 0500  Basic metabolic panel  Tomorrow morning,   STAT     09/26/18 1322   09/27/18 0500  CBC  Tomorrow morning,   STAT     09/26/18 1322   09/27/18 0500  Magnesium  Tomorrow morning,   STAT     09/26/18 1322   09/27/18 0500  Phosphorus  Tomorrow morning,   STAT     09/26/18 1322   09/27/18 0500  TSH  Tomorrow morning,   STAT     09/26/18 1322   09/27/18 0500  Basic metabolic panel  Daily,   STAT     09/26/18 2258   09/26/18 1500  Levetiracetam level  Once,   STAT     09/26/18 1322   09/26/18 1322  Urinalysis, Routine w reflex microscopic  Once,   STAT     09/26/18 1322   09/26/18 1217  Urinalysis, Routine w reflex microscopic  ONCE - STAT,   STAT     09/26/18 1218          Vitals/Pain Today's Vitals   09/26/18 2054 09/26/18 2130 09/26/18 2316 09/27/18 0338  BP:  125/69 124/63   Pulse:  86 70   Resp:  17 18  Temp:      TempSrc:      SpO2:  99% 99%    PainSc: 0-No pain   Asleep    Isolation Precautions No active isolations  Medications Medications  insulin regular, human (MYXREDLIN) 100 units/ 100 mL infusion ( Intravenous Stopped 09/27/18 0156)  lactated ringers infusion ( Intravenous Stopped 09/27/18 0257)  levothyroxine (SYNTHROID) tablet 100 mcg (has no administration in time range)  levETIRAcetam (KEPPRA) tablet 500 mg (500 mg Oral Given 09/26/18 2145)  0.9 %  sodium chloride infusion ( Intravenous Stopped 09/26/18 1557)  0.9 %  sodium chloride infusion ( Intravenous Stopped 09/26/18 2254)  dextrose 5 %-0.45 % sodium chloride infusion ( Intravenous Stopped 09/27/18 0142)  enoxaparin (LOVENOX) injection 40 mg (40 mg Subcutaneous Given 09/27/18 0019)  insulin glargine (LANTUS) injection 10 Units (10 Units Subcutaneous Given 09/27/18 0017)  insulin aspart (novoLOG) injection 0-15 Units (has no administration in time range)  insulin aspart (novoLOG) injection 0-5 Units (0 Units Subcutaneous Not Given 09/26/18 2311)  insulin aspart protamine- aspart (NOVOLOG MIX 70/30) injection 50 Units (50 Units Subcutaneous Given 09/26/18 1114)  sodium chloride 0.9 % bolus 1,000 mL (0 mLs Intravenous Stopped 09/26/18 1404)  potassium chloride 10 mEq in 100 mL IVPB ( Intravenous Stopped 09/26/18 1450)  lactated ringers bolus 1,000 mL (0 mLs Intravenous Stopped 09/26/18 1407)    Mobility walks Low fall risk   Focused Assessments DKA   R Recommendations: See Admitting Provider Note  Report given to:   Additional Notes:

## 2018-09-27 NOTE — Discharge Summary (Addendum)
[facility] [place] [facility]   PATIENT NAME: Francisco Colon    MR#:  [id number]  DATE OF BIRTH:  [birth year]  DATE OF ADMISSION:  09/26/2018 ADMITTING PHYSICIAN: Jude Ojie, MD  DATE OF DISCHARGE: 09/27/2018   PRIMARY CARE PHYSICIAN: Rayetta Humphrey, MD    ADMISSION DIAGNOSIS:  Abdominal pain [R10.9] Diabetic ketoacidosis without coma associated with type 1 diabetes mellitus (HCC) [E10.10] Non-intractable vomiting with nausea, unspecified vomiting type [R11.2] DKA (diabetic ketoacidoses) (HCC) [E11.10]  DISCHARGE DIAGNOSIS:  Active Problems:   DKA (diabetic ketoacidoses) (HCC)   SECONDARY DIAGNOSIS:   Past Medical History:  Diagnosis Date  . Autism spectrum disorder   . Hypothyroidism   . Seizure ([facility]) 2012   hypoglycemia induced - s/p eval by neuro  . Thyroid nodule    by prior US  . Type 1 diabetes [facility]) 2000    HOSPITAL COURSE:   Patient came to hospital with DKA due to running out of his insulin.  They had refills but they did not pick up from the pharmacy. Patient had some autism spectrum disorder and he is father and legal guardian takes care of his insulins and medication. As his blood sugar came under control after insulin drip and started back on his home dose regimen he was feeling fine and we decided to discharge him home.  As per his legal guardian he has his insulin ready to pick up at his pharmacy so they do not need prescription from me.  I have changed and increased his levothyroxine dose as his TSH level was high.  DISCHARGE CONDITIONS:   Stable  CONSULTS OBTAINED:    DRUG ALLERGIES:  No Known Allergies  DISCHARGE MEDICATIONS:   Allergies as of 09/27/2018   No Known Allergies     Medication List    TAKE these medications   Glucagon 1 MG/0.2ML Soaj Inject 1 mg into the skin as needed (hypoglycemic emergency).   levETIRAcetam 500 MG tablet Commonly known as: KEPPRA Take 500 mg by mouth 2 (two) times  daily.   levothyroxine 125 MCG tablet Commonly known as: SYNTHROID Take 1 tablet (125 mcg total) by mouth daily before breakfast. Start taking on: September 28, 2018 What changed:   medication strength  how much to take   NovoLOG Mix 70/30 (70-30) 100 UNIT/ML injection Generic drug: insulin aspart protamine- aspart Inject 22-30 Units into the skin See admin instructions. Inject 22u under the skin every morning and inject 30u under the skin every evening before meals        DISCHARGE INSTRUCTIONS:    Follow with PMD in 1 week.  If you experience worsening of your admission symptoms, develop shortness of breath, life threatening emergency, suicidal or homicidal thoughts you must seek medical attention immediately by calling 911 or calling your MD immediately  if symptoms less severe.  You Must read complete instructions/literature along with all the possible adverse reactions/side effects for all the Medicines you take and that have been prescribed to you. Take any new Medicines after you have completely understood and accept all the possible adverse reactions/side effects.   Please note  You were cared for by a hospitalist during your hospital stay. If you have any questions about your discharge medications or the care you received while you were in the hospital after you are discharged, you can call the unit and asked to speak with the hospitalist on call if the hospitalist that took care of you is not available. Once you  are discharged, your primary care physician will handle any further medical issues. Please note that NO REFILLS for any discharge medications will be authorized once you are discharged, as it is imperative that you return to your primary care physician (or establish a relationship with a primary care physician if you do not have one) for your aftercare needs so that they can reassess your need for medications and monitor your lab values.    Today   CHIEF  COMPLAINT:  No chief complaint on file.   HISTORY OF PRESENT ILLNESS:  Francisco Colon  is a 28 y.o. male with a known history type 1 diabetes mellitus, seizure disorder and autism spectrum disorder who presented to the emergency room with nausea and vomiting and some abdominal discomfort.  History was obtained from emergency room provider and from patient's uncle who is the patient's guardian.  Patient apparently said to have been running low on insulin recently and family had difficulty affording a refill.  There was no blood in the vomitus.  No diarrhea.  No chest pain.  No shortness of breath.  Patient evaluated in the emergency room and found to be in DKA with a blood sugar of 434 with anion gap of 26.  Started on IV fluids almost getting 2 L already.  Also started on insulin drip.  Patient with leukocytosis with white count of 20,000.  No fevers.  Urinalysis requested.  Chest x-ray negative.  Abdominal KUB requested.  Medical service called to admit patient for further evaluation and management.of    VITAL SIGNS:  Blood pressure (!) 101/51, pulse (!) 56, temperature 97.7 F (36.5 C), temperature source Oral, resp. rate 16, height 5\' 7"  (1.702 m), weight 70.2 kg, SpO2 100 %.  I/O:    Intake/Output Summary (Last 24 hours) at 09/27/2018 1210 Last data filed at 09/27/2018 0954 Gross per 24 hour  Intake 4747.45 ml  Output -  Net 4747.45 ml    PHYSICAL EXAMINATION:  GENERAL:  28 y.o.-year-old patient lying in the bed with no acute distress.  EYES: Pupils equal, round, reactive to light and accommodation. No scleral icterus. Extraocular muscles intact.  HEENT: Head atraumatic, normocephalic. Oropharynx and nasopharynx clear.  NECK:  Supple, no jugular venous distention. No thyroid enlargement, no tenderness.  LUNGS: Normal breath sounds bilaterally, no wheezing, rales,rhonchi or crepitation. No use of accessory muscles of respiration.  CARDIOVASCULAR: S1, S2 normal. No murmurs, rubs, or  gallops.  ABDOMEN: Soft, non-tender, non-distended. Bowel sounds present. No organomegaly or mass.  EXTREMITIES: No pedal edema, cyanosis, or clubbing.  NEUROLOGIC: Cranial nerves II through XII are intact. Muscle strength 5/5 in all extremities. Sensation intact. Gait not checked.  PSYCHIATRIC: The patient is alert and oriented x 1-2.  SKIN: No obvious rash, lesion, or ulcer.   DATA REVIEW:   CBC Recent Labs  Lab 09/27/18 0626  WBC 11.4*  HGB 12.6*  HCT 38.4*  PLT 211    Chemistries  Recent Labs  Lab 09/27/18 0626  NA 144  K 4.0  CL 110  CO2 24  GLUCOSE 160*  BUN 18  CREATININE 0.95  CALCIUM 8.9  MG 1.7    Cardiac Enzymes No results for input(s): TROPONINI in the last 168 hours.  Microbiology Results  Results for orders placed or performed during the hospital encounter of 09/26/18  SARS CORONAVIRUS 2 (TAT 6-24 HRS) Nasopharyngeal Nasopharyngeal Swab     Status: None   Collection Time: 09/26/18  1:32 PM   Specimen: Nasopharyngeal Swab  Result  Value Ref Range Status   SARS Coronavirus 2 NEGATIVE NEGATIVE Final    Comment: (NOTE) SARS-CoV-2 target nucleic acids are NOT DETECTED. The SARS-CoV-2 RNA is generally detectable in upper and lower respiratory specimens during the acute phase of infection. Negative results do not preclude SARS-CoV-2 infection, do not rule out co-infections with other pathogens, and should not be used as the sole basis for treatment or other patient management decisions. Negative results must be combined with clinical observations, patient history, and epidemiological information. The expected result is Negative. Fact Sheet for Patients: HairSlick.nohttps://www.fda.gov/media/138098/download Fact Sheet for Healthcare Providers: quierodirigir.comhttps://www.fda.gov/media/138095/download This test is not yet approved or cleared by the [place] FDA and  has been authorized for detection and/or diagnosis of SARS-CoV-2 by FDA under an Emergency Use Authorization  (EUA). This EUA will remain  in effect (meaning this test can be used) for the duration of the COVID-19 declaration under Section 56 4(b)(1) of the Act, 21 U.S.C. section 360bbb-3(b)(1), unless the authorization is terminated or revoked sooner. Performed at [facility], [address] [place], [place] [address]     RADIOLOGY:  Dg Abd 1 View  Result Date: 09/26/2018 CLINICAL DATA:  Abdominal pain for 1 day. EXAM: ABDOMEN - 1 VIEW COMPARISON:  None. FINDINGS: The bowel gas pattern is normal. No radio-opaque calculi or other significant radiographic abnormality are seen. IMPRESSION: Normal exam. Electronically Signed   By: Drusilla Kannerhomas  Dalessio M.D.   On: 09/26/2018 14:11   Dg Chest Portable 1 View  Result Date: 09/26/2018 CLINICAL DATA:  Cough.  Hyperglycemia.  History of diabetes. EXAM: PORTABLE CHEST 1 VIEW COMPARISON:  1247 report. FINDINGS: Patient rotated minimally left. Midline trachea. Normal heart size and mediastinal contours. No pleural effusion or pneumothorax. Clear lungs. IMPRESSION: No acute cardiopulmonary disease. Electronically Signed   By: Jeronimo GreavesKyle  Talbot M.D.   On: 09/26/2018 12:54    EKG:  No orders found for this or any previous visit.    Management plans discussed with the patient, family and they are in agreement.  CODE STATUS:     Code Status Orders  (From admission, onward)         Start     Ordered   09/26/18 1315  Full code  Continuous     09/26/18 1320        Code Status History    This patient has a current code status but no historical code status.   Advance Care Planning Activity      TOTAL TIME TAKING CARE OF THIS PATIENT: 35 minutes.    Altamese DillingVaibhavkumar Happy Begeman M.D on 09/27/2018 at 12:10 PM  Between 7am to 6pm - Pager - [phone]  After 6pm go to [email or web address] - password [email or web address]  [facility]  Office  [phone]  CC: Primary care physician; Rayetta HumphreyGeorge, Sionne A, MD   Note: This dictation was prepared with Dragon  dictation along with smaller phrase technology. Any transcriptional errors that result from this process are unintentional.

## 2018-10-01 LAB — LEVETIRACETAM LEVEL: Levetiracetam Lvl: <1 ug/mL — ABNORMAL LOW (ref 10.0–40.0)

## 2018-11-28 ENCOUNTER — Ambulatory Visit: Payer: Medicare Other | Admitting: Podiatry

## 2019-01-06 ENCOUNTER — Ambulatory Visit: Payer: Medicare Other | Admitting: Podiatry

## 2020-03-29 ENCOUNTER — Emergency Department
Admission: EM | Admit: 2020-03-29 | Discharge: 2020-03-29 | Disposition: A | Discharge status: Home / Self Care | Payer: Medicare Other | Attending: Emergency Medicine | Admitting: Emergency Medicine

## 2020-03-29 ENCOUNTER — Other Ambulatory Visit: Payer: Self-pay

## 2020-03-29 DIAGNOSIS — E039 Hypothyroidism, unspecified: Secondary | ICD-10-CM | POA: Insufficient documentation

## 2020-03-29 DIAGNOSIS — Z79899 Other long term (current) drug therapy: Secondary | ICD-10-CM | POA: Diagnosis not present

## 2020-03-29 DIAGNOSIS — E1010 Type 1 diabetes mellitus with ketoacidosis without coma: Secondary | ICD-10-CM | POA: Diagnosis not present

## 2020-03-29 DIAGNOSIS — F840 Autistic disorder: Secondary | ICD-10-CM | POA: Diagnosis not present

## 2020-03-29 DIAGNOSIS — R112 Nausea with vomiting, unspecified: Secondary | ICD-10-CM | POA: Diagnosis present

## 2020-03-29 DIAGNOSIS — K529 Noninfective gastroenteritis and colitis, unspecified: Secondary | ICD-10-CM | POA: Diagnosis not present

## 2020-03-29 LAB — CBC
HCT: 45.8 % (ref 39.0–52.0)
Hemoglobin: 14.9 g/dL (ref 13.0–17.0)
MCH: 25.7 pg — ABNORMAL LOW (ref 26.0–34.0)
MCHC: 32.5 g/dL (ref 30.0–36.0)
MCV: 79.1 fL — ABNORMAL LOW (ref 80.0–100.0)
Platelets: 222 10*3/uL (ref 150–400)
RBC: 5.79 MIL/uL (ref 4.22–5.81)
RDW: 12 % (ref 11.5–15.5)
WBC: 8.8 10*3/uL (ref 4.0–10.5)
nRBC: 0 % (ref 0.0–0.2)

## 2020-03-29 LAB — HEPATIC FUNCTION PANEL
ALT: 18 U/L (ref 0–44)
AST: 24 U/L (ref 15–41)
Albumin: 4.6 g/dL (ref 3.5–5.0)
Alkaline Phosphatase: 41 U/L (ref 38–126)
Bilirubin, Direct: 0.2 mg/dL (ref 0.0–0.2)
Indirect Bilirubin: 2.2 mg/dL — ABNORMAL HIGH (ref 0.3–0.9)
Total Bilirubin: 2.4 mg/dL — ABNORMAL HIGH (ref 0.3–1.2)
Total Protein: 7.8 g/dL (ref 6.5–8.1)

## 2020-03-29 LAB — CBG MONITORING, ED
Glucose-Capillary: 325 mg/dL — ABNORMAL HIGH (ref 70–99)
Glucose-Capillary: 349 mg/dL — ABNORMAL HIGH (ref 70–99)

## 2020-03-29 LAB — BASIC METABOLIC PANEL
Anion gap: 13 (ref 5–15)
BUN: 18 mg/dL (ref 6–20)
CO2: 23 mmol/L (ref 22–32)
Calcium: 9.3 mg/dL (ref 8.9–10.3)
Chloride: 101 mmol/L (ref 98–111)
Creatinine, Ser: 1.07 mg/dL (ref 0.61–1.24)
GFR, Estimated: >60 mL/min (ref >60)
Glucose, Bld: 337 mg/dL — ABNORMAL HIGH (ref 70–99)
Potassium: 3.9 mmol/L (ref 3.5–5.1)
Sodium: 137 mmol/L (ref 135–145)

## 2020-03-29 MED ORDER — ONDANSETRON 4 MG PO TBDP: 4.0000 mg | ORAL_TABLET | Freq: Three times a day (TID) | ORAL | 0 refills | Status: DC | PRN

## 2020-03-29 MED ORDER — ONDANSETRON 4 MG PO TBDP
4.0000 mg | ORAL_TABLET | Freq: Once | ORAL | Status: AC
  Administered 2020-03-29: 4 mg via ORAL
  Filled 2020-03-29: qty 1

## 2020-03-29 NOTE — ED Notes (Signed)
Pt accompanied with his uncle Kendal Hymen (not legal guardian). Legal guardian is at home but aware pt is here and is being d.c.

## 2020-03-29 NOTE — ED Provider Notes (Signed)
[facility] Emergency Department Provider Note   ____________________________________________    I have reviewed the triage vital signs and the nursing notes.   HISTORY  Chief Complaint Emesis and Abdominal Pain  History limited by autism, caregiver has provided history   HPI Francisco Colon is a 30 y.o. male who presents with nausea and vomiting and abdominal cramping.  Caregiver reports this is been ongoing since around 11 PM last night, no sick contacts reported.  No fevers noted.  Patient is feeling improved now after a Zofran.  No history of abdominal surgery.  One episode of diarrhea   Past Medical History:  Diagnosis Date  . Autism spectrum disorder   . Hypothyroidism   . Seizure (HCC) 2012   hypoglycemia induced - s/p eval by neuro  . Thyroid nodule    by prior US  . Type 1 diabetes (HCC) 2000    Patient Active Problem List   Diagnosis Date Noted  . DKA (diabetic ketoacidoses) 09/26/2018  . Type 1 diabetes (HCC)   . Hypothyroidism (acquired)   . Seizure disorder (HCC)   . Autism spectrum disorder   . Type 1 diabetes mellitus (HCC) 07/27/2011  . Active autistic disorder 04/27/2011  . Acquired hypothyroidism 04/27/2011  . Seizure (HCC) 04/27/2011  . Sickle cell trait (HCC) 04/27/2011    Past Surgical History:  Procedure Laterality Date  . NO PAST SURGERIES      Prior to Admission medications   Medication Sig Start Date End Date Taking? Authorizing Provider  ondansetron (ZOFRAN ODT) 4 MG disintegrating tablet Take 1 tablet (4 mg total) by mouth every 8 (eight) hours as needed. 03/29/20  Yes Jene Every, MD  Glucagon 1 MG/0.2ML SOAJ Inject 1 mg into the skin as needed (hypoglycemic emergency).     [provider]  levETIRAcetam (KEPPRA) 500 MG tablet Take 500 mg by mouth 2 (two) times daily.     [provider]  levothyroxine (SYNTHROID) 125 MCG tablet Take 1 tablet (125 mcg total) by mouth daily before  breakfast. 09/28/18   Altamese Dilling, MD  NOVOLOG MIX 70/30 (70-30) 100 UNIT/ML injection Inject 22-30 Units into the skin See admin instructions. Inject 22u under the skin every morning and inject 30u under the skin every evening before meals    [provider]     Allergies Patient has no known allergies.  Family History  Problem Relation Age of Onset  . Hypertension Maternal Grandmother   . Diabetes Brother   . Cancer Mother        lung, deceased    Social History Social History   Tobacco Use  . Smoking status: Never Smoker  . Smokeless tobacco: Never Used  Substance Use Topics  . Alcohol use: No  . Drug use: No    Review of Systems  Constitutional: No fever Eyes: No redness ENT: No congestion Cardiovascular: No reports of chest pain Respiratory: No cough Gastrointestinal: Complained of abdominal pain to caregiver, nausea vomiting diarrhea Genitourinary: No foul-smelling urine Musculoskeletal: Negative for joint swelling Skin: Negative for rash. Neurological: Negativeweakness   ____________________________________________   PHYSICAL EXAM:  VITAL SIGNS: ED Triage Vitals  Enc Vitals Group     BP 03/29/20 0905 122/86     Pulse Rate 03/29/20 0905 (!) 111     Resp 03/29/20 1113 18     Temp 03/29/20 0905 97.6 F (36.4 C)     Temp Source 03/29/20 0905 Oral     SpO2 03/29/20 0905  95 %     Weight --      Height --      Head Circumference --      Peak Flow --      Pain Score --      Pain Loc --      Pain Edu? --      Excl. in GC? --     Constitutional: Alert, no acute distress  Nose: No congestion/rhinnorhea. Mouth/Throat: Mucous membranes are moist.   Neck:  Painless ROM Cardiovascular: Normal rate, regular rhythm. Peri Jefferson peripheral circulation. Respiratory: Normal respiratory effort.  No retractions. Gastrointestinal: Soft and nontender. No distention.  No CVA tenderness.  Quite reassuring exam Genitourinary:  deferred Musculoskeletal:  Warm and well perfused Neurologic:  Normal speech and language. No gross focal neurologic deficits are appreciated.  Skin:  Skin is warm, dry and intact. No rash noted. Psychiatric: Mood and affect are normal. Speech and behavior are normal.  ____________________________________________   LABS (all labs ordered are listed, but only abnormal results are displayed)  Labs Reviewed  BASIC METABOLIC PANEL - Abnormal; Notable for the following components:      Result Value   Glucose, Bld 337 (*)    All other components within normal limits  CBC - Abnormal; Notable for the following components:   MCV 79.1 (*)    MCH 25.7 (*)    All other components within normal limits  HEPATIC FUNCTION PANEL - Abnormal; Notable for the following components:   Total Bilirubin 2.4 (*)    Indirect Bilirubin 2.2 (*)    All other components within normal limits  CBG MONITORING, ED - Abnormal; Notable for the following components:   Glucose-Capillary 325 (*)    All other components within normal limits  CBG MONITORING, ED - Abnormal; Notable for the following components:   Glucose-Capillary 349 (*)    All other components within normal limits  URINALYSIS, COMPLETE (UACMP) WITH MICROSCOPIC   ____________________________________________  EKG  None ____________________________________________  RADIOLOGY  None ____________________________________________   PROCEDURES  Procedure(s) performed: No  Procedures   Critical Care performed: No ____________________________________________   INITIAL IMPRESSION / ASSESSMENT AND PLAN / ED COURSE  Pertinent labs & imaging results that were available during my care of the patient were reviewed by me and considered in my medical decision making (see chart for details).  Patient presents with nausea vomiting as noted above, he does have a history of diabetes but BMP is reassuring, normal anion gap, normal CO2  White blood  cell count is normal.  Presentation is most consistent with viral gastroenteritis which is common in the community this time.  Abdominal exam is quite reassuring, patient with great improvement in vomiting after Zofran ODT, will DC with Zofran, outpatient follow-up as needed, return precautions discussed, caregiver agrees with this plan    ____________________________________________   FINAL CLINICAL IMPRESSION(S) / ED DIAGNOSES  Final diagnoses:  Gastroenteritis        Note:  This document was prepared using Dragon voice recognition software and may include unintentional dictation errors.   Jene Every, MD 03/29/20 1246

## 2020-03-29 NOTE — ED Triage Notes (Signed)
Pt is here with uncle/caregiver who states the pt has been up all night since 11pm with N/V and having c/o abd pain. States he is a diabetic and concerned about it being elevated, states normally the pt has the meter that you wear on your arm and the pt has been out of them for the past 2 weeks but has been taken his normal BID doses daily

## 2021-02-21 ENCOUNTER — Emergency Department: Payer: Medicare Other

## 2021-02-21 ENCOUNTER — Inpatient Hospital Stay
Admission: EM | Admit: 2021-02-21 | Discharge: 2021-02-22 | DRG: 342 | Disposition: A | Discharge status: Home / Self Care | Payer: Medicare Other | Attending: General Surgery | Admitting: General Surgery

## 2021-02-21 ENCOUNTER — Encounter: Payer: Self-pay | Admitting: Emergency Medicine

## 2021-02-21 ENCOUNTER — Other Ambulatory Visit: Payer: Self-pay

## 2021-02-21 ENCOUNTER — Encounter
Admission: EM | Disposition: A | Discharge status: Home / Self Care | Payer: Self-pay | Source: Home / Self Care | Attending: General Surgery

## 2021-02-21 DIAGNOSIS — Z801 Family history of malignant neoplasm of trachea, bronchus and lung: Secondary | ICD-10-CM

## 2021-02-21 DIAGNOSIS — E109 Type 1 diabetes mellitus without complications: Secondary | ICD-10-CM | POA: Diagnosis present

## 2021-02-21 DIAGNOSIS — E039 Hypothyroidism, unspecified: Secondary | ICD-10-CM | POA: Diagnosis not present

## 2021-02-21 DIAGNOSIS — G40909 Epilepsy, unspecified, not intractable, without status epilepticus: Secondary | ICD-10-CM

## 2021-02-21 DIAGNOSIS — K3580 Unspecified acute appendicitis: Secondary | ICD-10-CM | POA: Diagnosis not present

## 2021-02-21 DIAGNOSIS — Z79890 Hormone replacement therapy (postmenopausal): Secondary | ICD-10-CM

## 2021-02-21 DIAGNOSIS — K353 Acute appendicitis with localized peritonitis, without perforation or gangrene: Secondary | ICD-10-CM | POA: Diagnosis not present

## 2021-02-21 DIAGNOSIS — J9811 Atelectasis: Secondary | ICD-10-CM | POA: Diagnosis present

## 2021-02-21 DIAGNOSIS — R1084 Generalized abdominal pain: Secondary | ICD-10-CM | POA: Diagnosis not present

## 2021-02-21 DIAGNOSIS — K381 Appendicular concretions: Secondary | ICD-10-CM | POA: Diagnosis present

## 2021-02-21 DIAGNOSIS — Z794 Long term (current) use of insulin: Secondary | ICD-10-CM

## 2021-02-21 DIAGNOSIS — Z20822 Contact with and (suspected) exposure to covid-19: Secondary | ICD-10-CM | POA: Diagnosis present

## 2021-02-21 DIAGNOSIS — Z8249 Family history of ischemic heart disease and other diseases of the circulatory system: Secondary | ICD-10-CM

## 2021-02-21 DIAGNOSIS — Z79899 Other long term (current) drug therapy: Secondary | ICD-10-CM

## 2021-02-21 DIAGNOSIS — Z833 Family history of diabetes mellitus: Secondary | ICD-10-CM

## 2021-02-21 DIAGNOSIS — K389 Disease of appendix, unspecified: Secondary | ICD-10-CM | POA: Diagnosis present

## 2021-02-21 DIAGNOSIS — F840 Autistic disorder: Secondary | ICD-10-CM

## 2021-02-21 HISTORY — PX: XI ROBOTIC LAPAROSCOPIC ASSISTED APPENDECTOMY: SHX6877

## 2021-02-21 LAB — COMPREHENSIVE METABOLIC PANEL
ALT: 13 U/L (ref 0–44)
AST: 15 U/L (ref 15–41)
Albumin: 3.8 g/dL (ref 3.5–5.0)
Alkaline Phosphatase: 38 U/L (ref 38–126)
Anion gap: 10 (ref 5–15)
BUN: 13 mg/dL (ref 6–20)
CO2: 27 mmol/L (ref 22–32)
Calcium: 8.6 mg/dL — ABNORMAL LOW (ref 8.9–10.3)
Chloride: 99 mmol/L (ref 98–111)
Creatinine, Ser: 1.21 mg/dL (ref 0.61–1.24)
GFR, Estimated: >60 mL/min (ref >60)
Glucose, Bld: 274 mg/dL — ABNORMAL HIGH (ref 70–99)
Potassium: 3.7 mmol/L (ref 3.5–5.1)
Sodium: 136 mmol/L (ref 135–145)
Total Bilirubin: 2.4 mg/dL — ABNORMAL HIGH (ref 0.3–1.2)
Total Protein: 7.1 g/dL (ref 6.5–8.1)

## 2021-02-21 LAB — RESP PANEL BY RT-PCR (FLU A&B, COVID) ARPGX2
Influenza A by PCR: NEGATIVE
Influenza B by PCR: NEGATIVE
SARS Coronavirus 2 by RT PCR: NEGATIVE

## 2021-02-21 LAB — CBC
HCT: 46.7 % (ref 39.0–52.0)
Hemoglobin: 14.6 g/dL (ref 13.0–17.0)
MCH: 25.8 pg — ABNORMAL LOW (ref 26.0–34.0)
MCHC: 31.3 g/dL (ref 30.0–36.0)
MCV: 82.7 fL (ref 80.0–100.0)
Platelets: 210 10*3/uL (ref 150–400)
RBC: 5.65 MIL/uL (ref 4.22–5.81)
RDW: 12.7 % (ref 11.5–15.5)
WBC: 17.5 10*3/uL — ABNORMAL HIGH (ref 4.0–10.5)
nRBC: 0 % (ref 0.0–0.2)

## 2021-02-21 LAB — CBG MONITORING, ED
Glucose-Capillary: 288 mg/dL — ABNORMAL HIGH (ref 70–99)
Glucose-Capillary: 208 mg/dL — ABNORMAL HIGH (ref 70–99)

## 2021-02-21 LAB — GLUCOSE, CAPILLARY: Glucose-Capillary: 156 mg/dL — ABNORMAL HIGH (ref 70–99)

## 2021-02-21 LAB — LIPASE, BLOOD: Lipase: 28 U/L (ref 11–51)

## 2021-02-21 SURGERY — APPENDECTOMY, ROBOT-ASSISTED, LAPAROSCOPIC
Anesthesia: General | Site: Abdomen

## 2021-02-21 MED ORDER — METRONIDAZOLE 500 MG/100ML IV SOLN
500.0000 mg | Freq: Two times a day (BID) | INTRAVENOUS | Status: DC
  Administered 2021-02-21 – 2021-02-22 (×2): 500 mg via INTRAVENOUS
  Filled 2021-02-21 (×3): qty 100

## 2021-02-21 MED ORDER — IOHEXOL 300 MG/ML  SOLN
100.0000 mL | Freq: Once | INTRAMUSCULAR | Status: AC | PRN
  Administered 2021-02-21: 100 mL via INTRAVENOUS
  Filled 2021-02-21: qty 100

## 2021-02-21 MED ORDER — ENOXAPARIN SODIUM 40 MG/0.4ML IJ SOSY
40.0000 mg | PREFILLED_SYRINGE | INTRAMUSCULAR | Status: DC
  Administered 2021-02-22: 40 mg via SUBCUTANEOUS
  Filled 2021-02-21: qty 0.4

## 2021-02-21 MED ORDER — INSULIN GLARGINE-YFGN 100 UNIT/ML ~~LOC~~ SOLN
10.0000 [IU] | Freq: Every day | SUBCUTANEOUS | Status: DC
  Filled 2021-02-21 (×3): qty 0.1

## 2021-02-21 MED ORDER — METRONIDAZOLE 500 MG/100ML IV SOLN
500.0000 mg | Freq: Once | INTRAVENOUS | Status: AC
  Administered 2021-02-21: 500 mg via INTRAVENOUS
  Filled 2021-02-21: qty 100

## 2021-02-21 MED ORDER — MORPHINE SULFATE (PF) 4 MG/ML IV SOLN: 4.0000 mg | INTRAVENOUS | Status: DC | PRN

## 2021-02-21 MED ORDER — ACETAMINOPHEN 325 MG PO TABS: 650.0000 mg | ORAL_TABLET | Freq: Four times a day (QID) | ORAL | Status: DC | PRN

## 2021-02-21 MED ORDER — MIDAZOLAM HCL 2 MG/2ML IJ SOLN
INTRAMUSCULAR | Status: AC
  Filled 2021-02-21: qty 2

## 2021-02-21 MED ORDER — SODIUM CHLORIDE 0.9 % IV SOLN
1.0000 g | Freq: Once | INTRAVENOUS | Status: AC
  Administered 2021-02-21: 1 g via INTRAVENOUS
  Filled 2021-02-21: qty 10

## 2021-02-21 MED ORDER — KETOROLAC TROMETHAMINE 30 MG/ML IJ SOLN
15.0000 mg | Freq: Once | INTRAMUSCULAR | Status: AC
Start: 2021-02-21 — End: 2021-02-21
  Administered 2021-02-21: 15 mg via INTRAVENOUS
  Filled 2021-02-21 (×2): qty 1

## 2021-02-21 MED ORDER — PANTOPRAZOLE SODIUM 40 MG IV SOLR
40.0000 mg | Freq: Every day | INTRAVENOUS | Status: DC
  Administered 2021-02-21: 40 mg via INTRAVENOUS
  Filled 2021-02-21: qty 40

## 2021-02-21 MED ORDER — INSULIN ASPART 100 UNIT/ML IJ SOLN
0.0000 [IU] | INTRAMUSCULAR | Status: DC
  Administered 2021-02-21: 22:00:00 2 [IU] via SUBCUTANEOUS
  Administered 2021-02-21: 13:00:00 5 [IU] via SUBCUTANEOUS
  Administered 2021-02-21: 16:00:00 3 [IU] via SUBCUTANEOUS
  Administered 2021-02-22: 9 [IU] via SUBCUTANEOUS
  Administered 2021-02-22: 5 [IU] via SUBCUTANEOUS
  Administered 2021-02-22: 3 [IU] via SUBCUTANEOUS
  Filled 2021-02-21 (×6): qty 1

## 2021-02-21 MED ORDER — BUPIVACAINE-EPINEPHRINE (PF) 0.5% -1:200000 IJ SOLN
INTRAMUSCULAR | Status: AC
  Filled 2021-02-21: qty 30

## 2021-02-21 MED ORDER — LEVETIRACETAM IN NACL 500 MG/100ML IV SOLN
500.0000 mg | Freq: Two times a day (BID) | INTRAVENOUS | Status: DC
  Administered 2021-02-21 – 2021-02-22 (×2): 500 mg via INTRAVENOUS
  Filled 2021-02-21 (×4): qty 100

## 2021-02-21 MED ORDER — SODIUM CHLORIDE 0.9 % IV SOLN
2.0000 g | INTRAVENOUS | Status: DC
  Administered 2021-02-21: 2 g via INTRAVENOUS
  Filled 2021-02-21 (×2): qty 20

## 2021-02-21 MED ORDER — PROPOFOL 500 MG/50ML IV EMUL
INTRAVENOUS | Status: AC
  Filled 2021-02-21: qty 100

## 2021-02-21 MED ORDER — HYDROCODONE-ACETAMINOPHEN 5-325 MG PO TABS: 1.0000 | ORAL_TABLET | ORAL | Status: DC | PRN

## 2021-02-21 MED ORDER — FENTANYL CITRATE (PF) 100 MCG/2ML IJ SOLN
INTRAMUSCULAR | Status: AC
  Filled 2021-02-21: qty 2

## 2021-02-21 MED ORDER — ONDANSETRON HCL 4 MG/2ML IJ SOLN: 4.0000 mg | Freq: Four times a day (QID) | INTRAMUSCULAR | Status: DC | PRN

## 2021-02-21 MED ORDER — ACETAMINOPHEN 650 MG RE SUPP: 650.0000 mg | Freq: Four times a day (QID) | RECTAL | Status: DC | PRN

## 2021-02-21 MED ORDER — INSULIN GLARGINE-YFGN 100 UNIT/ML ~~LOC~~ SOLN
15.0000 [IU] | Freq: Every day | SUBCUTANEOUS | Status: DC
  Filled 2021-02-21: qty 0.15

## 2021-02-21 MED ORDER — LEVETIRACETAM IN NACL 500 MG/100ML IV SOLN
500.0000 mg | Freq: Once | INTRAVENOUS | Status: AC
  Administered 2021-02-21: 500 mg via INTRAVENOUS
  Filled 2021-02-21: qty 100

## 2021-02-21 MED ORDER — ONDANSETRON 4 MG PO TBDP
4.0000 mg | ORAL_TABLET | Freq: Four times a day (QID) | ORAL | Status: DC | PRN
  Filled 2021-02-21: qty 1

## 2021-02-21 MED ORDER — SODIUM CHLORIDE 0.9 % IV BOLUS
1000.0000 mL | Freq: Once | INTRAVENOUS | Status: AC
Start: 2021-02-21 — End: 2021-02-21
  Administered 2021-02-21: 1000 mL via INTRAVENOUS

## 2021-02-21 MED ORDER — SODIUM CHLORIDE 0.9 % IV SOLN: INTRAVENOUS | Status: DC

## 2021-02-21 MED ORDER — MORPHINE SULFATE (PF) 4 MG/ML IV SOLN
4.0000 mg | Freq: Once | INTRAVENOUS | Status: AC
  Administered 2021-02-21: 4 mg via INTRAVENOUS
  Filled 2021-02-21: qty 1

## 2021-02-21 SURGICAL SUPPLY — 67 items
BAG INFUSER PRESSURE 100CC (MISCELLANEOUS) ×1 IMPLANT
BLADE SURG SZ11 CARB STEEL (BLADE) ×2 IMPLANT
BNDG COHESIVE 4X5 TAN ST LF (GAUZE/BANDAGES/DRESSINGS) ×1 IMPLANT
CANNULA REDUC XI 12-8 STAPL (CANNULA) ×1
CANNULA REDUCER 12-8 DVNC XI (CANNULA) ×1 IMPLANT
COVER TIP SHEARS 8 DVNC (MISCELLANEOUS) ×1 IMPLANT
COVER TIP SHEARS 8MM DA VINCI (MISCELLANEOUS) ×1
DERMABOND ADVANCED (GAUZE/BANDAGES/DRESSINGS) ×1
DERMABOND ADVANCED .7 DNX12 (GAUZE/BANDAGES/DRESSINGS) ×1 IMPLANT
DRAPE ARM DVNC X/XI (DISPOSABLE) ×4 IMPLANT
DRAPE COLUMN DVNC XI (DISPOSABLE) ×1 IMPLANT
DRAPE DA VINCI XI ARM (DISPOSABLE) ×4
DRAPE DA VINCI XI COLUMN (DISPOSABLE) ×1
ELECT REM PT RETURN 9FT ADLT (ELECTROSURGICAL) ×2
ELECTRODE REM PT RTRN 9FT ADLT (ELECTROSURGICAL) ×1 IMPLANT
GLOVE SRG 8 PF TXTR STRL LF DI (GLOVE) IMPLANT
GLOVE SURG ENC TEXT LTX SZ7 (GLOVE) ×2 IMPLANT
GLOVE SURG SYN 6.5 ES PF (GLOVE) ×4 IMPLANT
GLOVE SURG SYN 6.5 PF PI (GLOVE) ×2 IMPLANT
GLOVE SURG UNDER POLY LF SZ6.5 (GLOVE) ×4 IMPLANT
GLOVE SURG UNDER POLY LF SZ8 (GLOVE) ×2
GOWN STRL REUS W/ TWL LRG LVL3 (GOWN DISPOSABLE) ×3 IMPLANT
GOWN STRL REUS W/TWL LRG LVL3 (GOWN DISPOSABLE) ×4
GRASPER SUT TROCAR 14GX15 (MISCELLANEOUS) IMPLANT
IRRIGATOR SUCT 8 DISP DVNC XI (IRRIGATION / IRRIGATOR) IMPLANT
IRRIGATOR SUCTION 8MM XI DISP (IRRIGATION / IRRIGATOR) ×1
IV NS 1000ML (IV SOLUTION) ×1
IV NS 1000ML BAXH (IV SOLUTION) IMPLANT
KIT PINK PAD W/HEAD ARE REST (MISCELLANEOUS) ×2 IMPLANT
KIT PINK PAD W/HEAD ARM REST (MISCELLANEOUS) ×1 IMPLANT
LABEL OR SOLS (LABEL) IMPLANT
MANIFOLD NEPTUNE II (INSTRUMENTS) ×2 IMPLANT
NDL INSUFFLATION 14GA 120MM (NEEDLE) ×1 IMPLANT
NEEDLE HYPO 22GX1.5 SAFETY (NEEDLE) ×2 IMPLANT
NEEDLE INSUFFLATION 14GA 120MM (NEEDLE) ×2 IMPLANT
OBTURATOR OPTICAL STANDARD 8MM (TROCAR) ×1
OBTURATOR OPTICAL STND 8 DVNC (TROCAR) ×1
OBTURATOR OPTICALSTD 8 DVNC (TROCAR) ×1 IMPLANT
PACK LAP CHOLECYSTECTOMY (MISCELLANEOUS) ×2 IMPLANT
POUCH SPECIMEN RETRIEVAL 10MM (ENDOMECHANICALS) ×2 IMPLANT
RELOAD STAPLE 45 2.5 WHT DVNC (STAPLE) IMPLANT
RELOAD STAPLE 45 3.5 BLU DVNC (STAPLE) ×1 IMPLANT
RELOAD STAPLER 2.5X45 WHT DVNC (STAPLE) IMPLANT
RELOAD STAPLER 3.5X45 BLU DVNC (STAPLE) ×1 IMPLANT
SEAL CANN UNIV 5-8 DVNC XI (MISCELLANEOUS) ×3 IMPLANT
SEAL XI 5MM-8MM UNIVERSAL (MISCELLANEOUS) ×3
SEALER VESSEL DA VINCI XI (MISCELLANEOUS) ×1
SEALER VESSEL EXT DVNC XI (MISCELLANEOUS) IMPLANT
SET TUBE SMOKE EVAC HIGH FLOW (TUBING) ×2 IMPLANT
SOLUTION ELECTROLUBE (MISCELLANEOUS) ×2 IMPLANT
SPONGE T-LAP 4X18 ~~LOC~~+RFID (SPONGE) ×2 IMPLANT
STAPLER 45 DA VINCI SURE FORM (STAPLE) ×1
STAPLER 45 SUREFORM DVNC (STAPLE) ×1 IMPLANT
STAPLER CANNULA SEAL DVNC XI (STAPLE) ×1 IMPLANT
STAPLER CANNULA SEAL XI (STAPLE) ×1
STAPLER RELOAD 2.5X45 WHITE (STAPLE)
STAPLER RELOAD 2.5X45 WHT DVNC (STAPLE)
STAPLER RELOAD 3.5X45 BLU DVNC (STAPLE) ×1
STAPLER RELOAD 3.5X45 BLUE (STAPLE) ×1
SUT MNCRL AB 4-0 PS2 18 (SUTURE) ×3 IMPLANT
SUT VIC AB 2-0 SH 27 (SUTURE) ×1
SUT VIC AB 2-0 SH 27XBRD (SUTURE) ×1 IMPLANT
SUT VICRYL 0 AB UR-6 (SUTURE) ×2 IMPLANT
SUT VLOC 90 6 CV-15 VIOLET (SUTURE) ×2 IMPLANT
SYR 30ML LL (SYRINGE) ×2 IMPLANT
TRAY FOLEY MTR SLVR 16FR STAT (SET/KITS/TRAYS/PACK) ×1 IMPLANT
WATER STERILE IRR 500ML POUR (IV SOLUTION) ×2 IMPLANT

## 2021-02-21 NOTE — ED Notes (Signed)
Hospitalist at bedside 

## 2021-02-21 NOTE — ED Notes (Signed)
Patient transported to CT 

## 2021-02-21 NOTE — Anesthesia Preprocedure Evaluation (Addendum)
Anesthesia Evaluation  Patient identified by MRN, date of birth, ID band Patient confused    Reviewed: Allergy & Precautions, NPO status , Patient's Chart, lab work & pertinent test results  History of Anesthesia Complications Negative for: history of anesthetic complications  Airway Mallampati: III  TM Distance: >3 FB Neck ROM: Full    Dental  (+) Poor Dentition   Pulmonary neg pulmonary ROS, neg sleep apnea, neg COPD, Patient abstained from smoking.Not current smoker,    Pulmonary exam normal breath sounds clear to auscultation       Cardiovascular Exercise Tolerance: Good METS(-) hypertension(-) CAD and (-) Past MI negative cardio ROS  (-) dysrhythmias  Rhythm:Regular Rate:Normal - Systolic murmurs    Neuro/Psych Seizures -, Well Controlled,  PSYCHIATRIC DISORDERS Legal guardian says he hasn't had a seizure in a long time    GI/Hepatic neg GERD  ,(+)     (-) substance abuse  ,   Endo/Other  diabetesHypothyroidism   Renal/GU negative Renal ROS     Musculoskeletal   Abdominal   Peds  Hematology   Anesthesia Other Findings Past Medical History: No date: Autism spectrum disorder 09/26/2018: DKA (diabetic ketoacidoses) No date: Hypothyroidism 2012: Seizure (HCC)     Comment:  hypoglycemia induced - s/p eval by neuro No date: Thyroid nodule     Comment:  by prior US 2000: Type 1 diabetes (HCC)  Reproductive/Obstetrics                            Anesthesia Physical Anesthesia Plan  ASA: 3  Anesthesia Plan: General   Post-op Pain Management: Ofirmev IV (intra-op) and Toradol IV (intra-op)   Induction: Intravenous  PONV Risk Score and Plan: 3 and Ondansetron, Dexamethasone and Midazolam  Airway Management Planned: Oral ETT  Additional Equipment: None  Intra-op Plan:   Post-operative Plan: Extubation in OR  Informed Consent: I have reviewed the patients History and Physical,  chart, labs and discussed the procedure including the risks, benefits and alternatives for the proposed anesthesia with the patient or authorized representative who has indicated his/her understanding and acceptance.     Dental advisory given and Consent reviewed with POA  Plan Discussed with: CRNA and Surgeon  Anesthesia Plan Comments: (Discussed risks of anesthesia with patien's legal guardian/uncle [name] by phone on 02/21/2021 @2305 , including PONV, sore throat, lip/dental/eye damage. Rare risks discussed as well, such as cardiorespiratory and neurological sequelae, and allergic reactions. Discussed the role of CRNA in patient's perioperative care. He understands.)       Anesthesia Quick Evaluation

## 2021-02-21 NOTE — Progress Notes (Signed)
Pt reports to the ED with complaints of emesis. States that he threw up 2 times in the last 12 hours. Denies changes in food consumption, denies other changes in lifestyle. Currently experiencing left sided pain described as sharp pain and rating as 7-8/10.   Pt's uncle is present for triage and states that pt has diabetes (unsure if it is type 1 or 2). Pt's uncle stated that pt will sneak food during the night and eat cookies and chips without anyone else's knowledge.

## 2021-02-21 NOTE — Assessment & Plan Note (Signed)
Stable. Continue synthroid when able to take po liquids. No need for IV synthroid at this time unless NPO > 48 hours.

## 2021-02-21 NOTE — Assessment & Plan Note (Signed)
Verified with pt's legal guardian that pt took his keppra dose(500 mg) this AM. Change over to IV keppra while npo. Can change back to po keppra when taking clear liquids or other advanced diet.

## 2021-02-21 NOTE — ED Notes (Signed)
Pt changed into gown and non skid socks. Pt states last time he ate or drank was 2200 last night.

## 2021-02-21 NOTE — Progress Notes (Signed)
Inpatient Diabetes Program Recommendations  AACE/ADA: New Consensus Statement on Inpatient Glycemic Control   Target Ranges:  Prepandial:   less than 140 mg/dL      Peak postprandial:   less than 180 mg/dL (1-2 hours)      Critically ill patients:  140 - 180 mg/dL    Latest Reference Range & Units 02/21/21 10:16  CO2 22 - 32 mmol/L 27  Glucose 70 - 99 mg/dL 287 (H)  Anion gap 5 - 15  10   Review of Glycemic Control  Diabetes history: DM1 (makes NO insulin; requires basal, correction, and meal coverage insulin) Outpatient Diabetes medications: 70/30 22 units QAM, 70/30 30 units QPM Current orders for Inpatient glycemic control: None; in ED  Inpatient Diabetes Program Recommendations:    Insulin: If admitted, please consider ordering Semglee 20 units Q24H, CBGs Q4H (or ACHS if diet ordered), and Novolog 0-9 units Q4H (or ACHS if diet ordered). If diet is ordered, please consider ordering Novolog 3 units TID with meals for meal coverage if patient eats at least 50% of meals.  NOTE: Patient in ED with nausea, vomiting, left sided pain. In reviewing chart, noted patient sees Dr. Tedd Sias (Endocrinologist) and last seen 08/08/20. Per office note on 08/08/20, patient with "autism and type 1 diabetes. He was last seen in 01/2020. He did not show for follow up appts in 04/2020 or 05/2020. He has a learning disability and is dependent on relatives to bring him to appts.Eliott lives at home with his grandmother Corrie Dandy) and his uncle Kendal Hymen. Uncle Vale Haven lives across the street and aunt Okey Regal lives next door. Uncle Bonnie administers Mohamad's insulin typically but sometimes Judi Saa does this as well. Corrie Dandy and Vale Haven are involved with reviewing the blood glucose readings and refilling his medications.He takes NovoLog 70/30 insulin 20 units before breakfast and 30 units before supper plus extra 4 units per 50 that sugar is over 200 mg/dl."   Thanks, Orlando Penner, RN, MSN, CDE Diabetes  Coordinator Inpatient Diabetes Program [phone] (Team Pager from 8am to 5pm)

## 2021-02-21 NOTE — ED Triage Notes (Signed)
Family member states that he has been having abd pain and vomiting since Sunday, hx of seizures, hx of sugar, family states while everyone sleeps he is up eating ans snacking all night but last night he was vomiting

## 2021-02-21 NOTE — Progress Notes (Signed)
° °  Now his uncle david spruill changed his mind and now states pt did not take any of his meds including insulin and keppra. Last doses of insulin and keppra were yesterday around 5 pm.  Will give dose of IV keppra. SQ novolog.  Carollee Herter, DO [facility]

## 2021-02-21 NOTE — Subjective & Objective (Signed)
Reason for internal medicine consultation: Type 1 diabetes, seizure disorder Requesting physician: Hazle Quant, MD CC: abd pain, N/V HPI: 31 year old African-American male with a history of autism, type 1 diabetes, seizure disorder presented to the ER today with abdominal pain.  Patient reportedly had nausea and vomiting since Sunday.  He started having some left-sided abdominal pain.  Patient's guardian is not at the bedside when I interviewed the patient.  Patient is a poor historian.  He describes left lower quadrant abdominal pain.  CT scan done demonstrates appendix that is in the midline with tracking to the left just below the level of the umbilicus.  He has 2 appendicoliths.  Patient's been seen by surgery.  They are planning on appendectomy today.  He is NPO.  I was later able to contact the patient's legal guardian Francisco Colon.  He states the patient did take his NovoLog injection this morning.  He also took his Keppra dose this morning.  [facility] contacted for consultation for management of his type 1 diabetes and seizure disorder.

## 2021-02-21 NOTE — Assessment & Plan Note (Signed)
chronic

## 2021-02-21 NOTE — ED Notes (Signed)
Spoke with pt uncle [name] to inform of new room.

## 2021-02-21 NOTE — ED Provider Notes (Signed)
[facility] Provider Note    Event Date/Time   First MD Initiated Contact with Patient 02/21/21 [phone]     (approximate)   History   Emesis and Abdominal Pain   HPI  Francisco Colon is a 31 y.o. male with past medical history of autism spectrum disorder, hypothyroidism, seizure disorder who presents with abdominal pain.  Patient's dad tells me that on Sunday night patient was up by himself watching TV and had significant intake of junk food.  Since that time he has had some complaints of generalized abdominal pain.  2 nights ago he was vomiting all night but is not been vomiting today.  No diarrhea or constipation.  No fevers or chills.  Patient since Sunday has not had much p.o. intake.  Patient points to the entire abdomen when asked about the location of the pain.    Past Medical History:  Diagnosis Date   Autism spectrum disorder    Hypothyroidism    Seizure (HCC) 2012   hypoglycemia induced - s/p eval by neuro   Thyroid nodule    by prior US   Type 1 diabetes (HCC) 2000    Patient Active Problem List   Diagnosis Date Noted   DKA (diabetic ketoacidoses) 09/26/2018   Type 1 diabetes (HCC)    Hypothyroidism (acquired)    Seizure disorder (HCC)    Autism spectrum disorder    Type 1 diabetes mellitus (HCC) 07/27/2011   Active autistic disorder 04/27/2011   Acquired hypothyroidism 04/27/2011   Seizure (HCC) 04/27/2011   Sickle cell trait (HCC) 04/27/2011     Physical Exam  Triage Vital Signs: ED Triage Vitals  Enc Vitals Group     BP 02/21/21 0843 (!) 142/86     Pulse Rate 02/21/21 0843 83     Resp 02/21/21 0848 16     Temp 02/21/21 0843 99.5 F (37.5 C)     Temp Source 02/21/21 0843 Oral     SpO2 02/21/21 0843 92 %     Weight 02/21/21 0908 170 lb (77.1 kg)     Height 02/21/21 0908 5\' 7"  (1.702 m)     Head Circumference --      Peak Flow --      Pain Score 02/21/21 0908 7     Pain Loc --      Pain Edu? --      Excl. in GC? --      Most recent vital signs: Vitals:   02/21/21 0843 02/21/21 0848  BP: (!) 142/86   Pulse: 83   Resp:  16  Temp: 99.5 F (37.5 C)   SpO2: 92%      General: Awake, no distress.  CV:  Good peripheral perfusion.  Resp:  Normal effort.  Abd:  No distention.  Abdomen is tender in all 4 quadrants, no guarding Neuro:             Awake, Alert, Oriented x 3  Other:     ED Results / Procedures / Treatments  Labs (all labs ordered are listed, but only abnormal results are displayed) Labs Reviewed  CBC - Abnormal; Notable for the following components:      Result Value   WBC 17.5 (*)    MCH 25.8 (*)    All other components within normal limits  COMPREHENSIVE METABOLIC PANEL - Abnormal; Notable for the following components:   Glucose, Bld 274 (*)    Calcium 8.6 (*)    Total Bilirubin 2.4 (*)  All other components within normal limits  RESP PANEL BY RT-PCR (FLU A&B, COVID) ARPGX2  LIPASE, BLOOD  URINALYSIS, ROUTINE W REFLEX MICROSCOPIC     EKG     RADIOLOGY CT abdomen and pelvis reviewed by myself which is positive for appendicitis, agree with radiology report  PROCEDURES:  Critical Care performed: No     MEDICATIONS ORDERED IN ED: Medications  cefTRIAXone (ROCEPHIN) 1 g in sodium chloride 0.9 % 100 mL IVPB (has no administration in time range)  metroNIDAZOLE (FLAGYL) IVPB 500 mg (has no administration in time range)  sodium chloride 0.9 % bolus 1,000 mL (1,000 mLs Intravenous New Bag/Given 02/21/21 0947)  ketorolac (TORADOL) 30 MG/ML injection 15 mg (15 mg Intravenous Given 02/21/21 0948)  iohexol (OMNIPAQUE) 300 MG/ML solution 100 mL (100 mLs Intravenous Contrast Given 02/21/21 1107)     IMPRESSION / MDM / ASSESSMENT AND PLAN / ED COURSE  I reviewed the triage vital signs and the nursing notes.                              Differential diagnosis includes, but is not limited to, enteritis, appendicitis, pancreatitis, biliary colic  31 year old male with  autism seizure disorder presents with generalized abdominal pain.  Patient's vital signs are within normal limits.  On exam overall appears well but he does have diffuse abdominal tenderness.  Has trouble localizing the pain.  Dad tells me that he was up on Sunday night and had significant junk food intake and since that time has had poor p.o. intake and multiple episodes of emesis.  Reviewed his labs which are notable for leukocytosis 17.5.  Based on his exam I am concerned for acute surgical pathology including appendicitis.  We will obtain a CT abdomen pelvis with contrast and treat supportively with fluids and Toradol.  CT abdomen pelvis is positive for appendicitis with appendicolith.  Discussed with surgery who will admit the patient.  We will give IV antibiotics. Clinical Course as of 02/21/21 1156  Tue Feb 21, 2021  1135  IMPRESSION: Positive for Acute Appendicitis. Note appendix tracks across midline into the LEFT mid abdomen and contains two sizable appendicoliths. Dilated and mildly inflamed appendix, with no evidence of perforation or abscess.   [KM]    Clinical Course User Index [KM] Georga Hacking, MD     FINAL CLINICAL IMPRESSION(S) / ED DIAGNOSES   Final diagnoses:  Acute appendicitis, unspecified acute appendicitis type     Rx / DC Orders   ED Discharge Orders     None        Note:  This document was prepared using Dragon voice recognition software and may include unintentional dictation errors.   Georga Hacking, MD 02/21/21 [phone]

## 2021-02-21 NOTE — Assessment & Plan Note (Signed)
Pt being admitted by general surgery service. Management per surgery team.

## 2021-02-21 NOTE — H&P (Signed)
SURGICAL CONSULTATION NOTE   HISTORY OF PRESENT ILLNESS (HPI):  31 y.o. male presented to [facility] ED for evaluation of abdominal pain since 2 days ago. Patient reports started having pain 2 days ago.  Pain localized to the lower abdomen.  Pain in the left lower quadrant.  No pain radiation.  Aggravating factor is applying pressure.  There has been no alleviating factors.  Patient denies any nausea or vomiting.  At the ED he was found leukocytosis.  CT scan of the abdomen and pelvis shows dilated appendix with appendicolith.  There is associated fat stranding.  The patient able to the images.  There is no free fluid or free air.  Patient with history of autism, seizure disorder, hypothyroidism and type 1 diabetes.  Surgery is consulted by Dr. Starleen Blue in this context for evaluation and management of acute appendicitis.  PAST MEDICAL HISTORY (PMH):  Past Medical History:  Diagnosis Date   Autism spectrum disorder    Hypothyroidism    Seizure ([place]) 05-11-10   hypoglycemia induced - s/p eval by neuro   Thyroid nodule    by prior US   Type 1 diabetes ([place]) 2000     PAST SURGICAL HISTORY ([place]):  Past Surgical History:  Procedure Laterality Date   NO PAST SURGERIES       MEDICATIONS:  Prior to Admission medications   Medication Sig Start Date End Date Taking? Authorizing Provider  Glucagon 1 MG/0.2ML SOAJ Inject 1 mg into the skin as needed (hypoglycemic emergency).     [provider]  levETIRAcetam (KEPPRA) 500 MG tablet Take 500 mg by mouth 2 (two) times daily.     [provider]  levothyroxine (SYNTHROID) 125 MCG tablet Take 1 tablet (125 mcg total) by mouth daily before breakfast. 09/28/18   Vaughan Basta, MD  NOVOLOG MIX 70/30 (70-30) 100 UNIT/ML injection Inject 22-30 Units into the skin See admin instructions. Inject 22u under the skin every morning and inject 30u under the skin every evening before meals    [provider]  ondansetron (ZOFRAN ODT) 4  MG disintegrating tablet Take 1 tablet (4 mg total) by mouth every 8 (eight) hours as needed. 03/29/20   Lavonia Drafts, MD     ALLERGIES:  No Known Allergies   SOCIAL HISTORY:  Social History   Socioeconomic History   Marital status: Single    Spouse name: Not on file   Number of children: Not on file   Years of education: Not on file   Highest education level: Not on file  Occupational History   Not on file  Tobacco Use   Smoking status: Never   Smokeless tobacco: Never  Substance and Sexual Activity   Alcohol use: No   Drug use: No   Sexual activity: Not on file  Other Topics Concern   Not on file  Social History Narrative   Lives with grandmother Arnoldo Morale) and brother Weidinger)   Caregiver is uncle Rondel Jumbo)   Mother died from cancer around 05/11/07.   Edu: 12th grade, consider [employer]   Occ: nothing currently   Activity: no regular exercise   Diet: good water, fruits/vegetables daily   Social Determinants of Health   Financial Resource Strain: Not on file  Food Insecurity: Not on file  Transportation Needs: Not on file  Physical Activity: Not on file  Stress: Not on file  Social Connections: Not on file  Intimate Partner Violence: Not on file      FAMILY HISTORY:  Family History  Problem Relation Age of Onset   Hypertension Maternal Grandmother    Diabetes Brother    Cancer Mother        lung, deceased     REVIEW OF SYSTEMS:  Constitutional: denies weight loss, fever, chills, or sweats  Eyes: denies any other vision changes, history of eye injury  ENT: denies sore throat, hearing problems  Respiratory: denies shortness of breath, wheezing  Cardiovascular: denies chest pain, palpitations  Gastrointestinal: positive abdominal pain, nausea and vomiting Genitourinary: denies burning with urination or urinary frequency Musculoskeletal: denies any other joint pains or cramps  Skin: denies any other rashes or skin discolorations  Neurological: denies any  other headache, dizziness, weakness  Psychiatric: denies any other depression, anxiety   All other review of systems were negative   VITAL SIGNS:  Temp:  [99.5 F (37.5 C)] 99.5 F (37.5 C) (01/31 0843) Pulse Rate:  [83] 83 (01/31 0843) Resp:  [16] 16 (01/31 0848) BP: (142)/(86) 142/86 (01/31 0843) SpO2:  [92 %] 92 % (01/31 0843) Weight:  [77.1 kg] 77.1 kg (01/31 0908)     Height: 5\' 7"  (170.2 cm) Weight: 77.1 kg BMI (Calculated): 26.62   INTAKE/OUTPUT:  This shift: No intake/output data recorded.  Last 2 shifts: @IOLAST2SHIFTS @   PHYSICAL EXAM:  Constitutional:  -- Normal body habitus  -- Awake, alert, and oriented x3  Eyes:  -- Pupils equally round and reactive to light  -- No scleral icterus  Ear, nose, and throat:  -- No jugular venous distension  Pulmonary:  -- No crackles  -- Equal breath sounds bilaterally -- Breathing non-labored at rest Cardiovascular:  -- S1, S2 present  -- No pericardial rubs Gastrointestinal:  -- Abdomen soft, tender to palpation in left lower quadrant, non-distended, no guarding or rebound tenderness -- No abdominal masses appreciated, pulsatile or otherwise  Musculoskeletal and Integumentary:  -- Wounds or skin discoloration: None appreciated -- Extremities: B/L UE and LE FROM, hands and feet warm, no edema  Neurologic:  -- Motor function: intact and symmetric -- Sensation: intact and symmetric   Labs:  CBC Latest Ref Rng & Units 02/21/2021 03/29/2020 09/27/2018  WBC 4.0 - 10.5 K/uL 17.5(H) 8.8 11.4(H)  Hemoglobin 13.0 - 17.0 g/dL 14.6 14.9 12.6(L)  Hematocrit 39.0 - 52.0 % 46.7 45.8 38.4(L)  Platelets 150 - 400 K/uL 210 222 211   CMP Latest Ref Rng & Units 02/21/2021 03/29/2020 09/27/2018  Glucose 70 - 99 mg/dL 274(H) 337(H) 160(H)  BUN 6 - 20 mg/dL 13 18 18   Creatinine 0.61 - 1.24 mg/dL 1.21 1.07 0.95  Sodium 135 - 145 mmol/L 136 137 144  Potassium 3.5 - 5.1 mmol/L 3.7 3.9 4.0  Chloride 98 - 111 mmol/L 99 101 110  CO2 22 - 32 mmol/L  27 23 24   Calcium 8.9 - 10.3 mg/dL 8.6(L) 9.3 8.9  Total Protein 6.5 - 8.1 g/dL 7.1 7.8 -  Total Bilirubin 0.3 - 1.2 mg/dL 2.4(H) 2.4(H) -  Alkaline Phos 38 - 126 U/L 38 41 -  AST 15 - 41 U/L 15 24 -  ALT 0 - 44 U/L 13 18 -    Imaging studies:  EXAM: CT ABDOMEN AND PELVIS WITH CONTRAST   TECHNIQUE: Multidetector CT imaging of the abdomen and pelvis was performed using the standard protocol following bolus administration of intravenous contrast.   RADIATION DOSE REDUCTION: This exam was performed according to the departmental dose-optimization program which includes automated exposure control, adjustment of the mA and/or kV according  to patient size and/or use of iterative reconstruction technique.   CONTRAST:  174mL OMNIPAQUE IOHEXOL 300 MG/ML  SOLN   COMPARISON:  Abdominal radiograph 09/26/2018.   FINDINGS: Lower chest: Low lung volumes with mild lung base atelectasis. No pericardial or pleural effusion.   Hepatobiliary: Negative.   Pancreas: Negative.   Spleen: Negative.   Adrenals/Urinary Tract: Negative.   Stomach/Bowel: Retained stool in the rectum. Redundant sigmoid colon with more gas than retained stool. Intermittent decompressed descending colon. Similar retained gas and stool in the transverse colon. Mostly fluid in the right colon which is nondilated.   Evidence that the appendix arises medially from the cecum on series 2, image 51, and tracks across midline to the left. In the mid and distal appendix 2 elongated appendicoliths measure up to 15 mm in length and 7 mm thickness. The tip of the appendix is indistinct and except for added space the appendix appears mildly inflamed.   Appendix: Location: In the midline and tracking across midline to the left just below the level of the umbilicus (coronal image 31).   Diameter: Up to 14 mm, abnormal (series 2, image 55).   Appendicolith: Two elongated appendicoliths measure up to 15 mm in length and 7 mm  thickness located in the mid to distal appendix.   Mucosal hyper-enhancement: Minimal   Extraluminal gas: Negative   Periappendiceal collection: Negative.   Negative stomach and duodenum. No dilated small bowel. Fluid-filled but nondilated terminal ileum and distal small bowel. No free air or free fluid identified.   Vascular/Lymphatic: Major vascular structures in the abdomen and pelvis appear to be patent, normal. No lymphadenopathy identified.   Reproductive: Negative.   Other: No pelvic free fluid.   Musculoskeletal: Negative.   IMPRESSION: Positive for Acute Appendicitis. Note appendix tracks across midline into the LEFT mid abdomen and contains two sizable appendicoliths. Dilated and mildly inflamed appendix, with no evidence of perforation or abscess.     Electronically Signed   By: Genevie Ann M.D.   On: 02/21/2021 11:23  Assessment/Plan:  31 y.o. male with acute appendicitis, complicated by pertinent comorbidities including seizure disorder, autism, type 1 diabetes.  Patient with history, physical exam and images consistent with acute appendicitis. Patient oriented about diagnosis and surgical management as treatment. Patient oriented about goals of surgery and its risk including: bowel injury, infection, abscess, bleeding, leak from cecum, intestinal adhesions, bowel obstruction, fistula, injury to the ureter among others.  Patient understood and agreed to proceed with surgery. Will admit patient, already started on antibiotic therapy, will give IV hydration since patient is NPO and schedule to OR.   His uncle was at bedside.  He was oriented about the diagnosis and recommendation for surgical management.  He will call the brother of the patient who apparently is the one to make decision for him.  Arnold Long, MD

## 2021-02-21 NOTE — Consult Note (Signed)
Hospitalist Consultation History and Physical    Francisco BeckersJamal N Colon ZOX:[id number] DOB: 1990-06-12 DOA: 02/21/2021  PCP: Francisco HumphreyGeorge, Francisco A, MD   Patient coming from: Home  I have personally briefly reviewed patient's old medical records [facility]  Reason for internal medicine consultation: Type 1 diabetes, seizure disorder Requesting physician: Francisco Quantintron-Diaz, MD CC: abd pain, N/V HPI: 31 year old African-American male with Colon history of autism, type 1 diabetes, seizure disorder presented to the ER today with abdominal pain.  Patient reportedly had nausea and vomiting since Sunday.  He started having some left-sided abdominal pain.  Patient's guardian is not at the bedside when I interviewed the patient.  Patient is Colon poor historian.  He describes left lower quadrant abdominal pain.  CT scan done demonstrates appendix that is in the midline with tracking to the left just below the level of the umbilicus.  He has 2 appendicoliths.  Patient's been seen by surgery.  They are planning on appendectomy today.  He is NPO.  I was later able to contact the patient's legal guardian Francisco MoltDavid Colon.  He states the patient did take his NovoLog injection this morning.  He also took his Keppra dose this morning.  [facility] contacted for consultation for management of his type 1 diabetes and seizure disorder.   ED Course: CT scan shows appendicitis in the left mid quadrant.  Review of Systems:  Review of Systems  Constitutional: Negative.   HENT: Negative.    Eyes: Negative.   Respiratory: Negative.    Cardiovascular: Negative.   Gastrointestinal:  Positive for abdominal pain, nausea and vomiting.  Genitourinary: Negative.   Musculoskeletal: Negative.   Skin: Negative.   Neurological: Negative.   Endo/Heme/Allergies: Negative.   Psychiatric/Behavioral: Negative.    All other systems reviewed and are negative.  Past Medical History:  Diagnosis Date   Autism spectrum disorder     DKA (diabetic ketoacidoses) 09/26/2018   Hypothyroidism    Seizure ([facility]) 2012   hypoglycemia induced - s/p eval by neuro   Thyroid nodule    by prior US   Type 1 diabetes ([facility]) 2000    Past Surgical History:  Procedure Laterality Date   NO PAST SURGERIES       reports that he has never smoked. He has never used smokeless tobacco. He reports that he does not drink alcohol and does not use drugs.  No Known Allergies  Family History  Problem Relation Age of Onset   Hypertension Maternal Grandmother    Diabetes Brother    Cancer Mother        lung, deceased    Prior to Admission medications   Medication Sig Start Date End Date Taking? Authorizing Provider  Glucagon 1 MG/0.2ML SOAJ Inject 1 mg into the skin as needed (hypoglycemic emergency).     [provider]  levETIRAcetam (KEPPRA) 500 MG tablet Take 500 mg by mouth 2 (two) times daily.     [provider]  levothyroxine (SYNTHROID) 125 MCG tablet Take 1 tablet (125 mcg total) by mouth daily before breakfast. 09/28/18   Altamese DillingVachhani, Vaibhavkumar, MD  NOVOLOG MIX 70/30 (70-30) 100 UNIT/ML injection Inject 22-30 Units into the skin See admin instructions. Inject 22u under the skin every morning and inject 30u under the skin every evening before meals    [provider]  ondansetron (ZOFRAN ODT) 4 MG disintegrating tablet Take 1 tablet (4 mg total) by mouth every 8 (eight) hours as needed. 03/29/20   Jene EveryKinner, Robert, MD  Physical Exam: Vitals:   02/21/21 0843 02/21/21 0848 02/21/21 0908 02/21/21 1210  BP: (!) 142/86   (!) 131/93  Pulse: 83   80  Resp:  16  18  Temp: 99.5 F (37.5 C)     TempSrc: Oral     SpO2: 92%   96%  Weight:   77.1 kg   Height:   5\' 7"  (1.702 m)     Physical Exam Vitals and nursing note reviewed.  Constitutional:      General: He is not in acute distress.    Appearance: Normal appearance. He is normal weight. He is not ill-appearing, toxic-appearing or diaphoretic.  HENT:      Head: Normocephalic and atraumatic.     Nose: Nose normal. No rhinorrhea.  Eyes:     General: No scleral icterus. Cardiovascular:     Rate and Rhythm: Normal rate and regular rhythm.     Pulses: Normal pulses.     Heart sounds: Murmur heard.  Systolic murmur is present with Colon grade of 2/6.  Pulmonary:     Effort: Pulmonary effort is normal. No respiratory distress.     Breath sounds: Normal breath sounds. No wheezing or rales.  Abdominal:     General: Abdomen is flat. Bowel sounds are normal.     Tenderness: There is abdominal tenderness.    Musculoskeletal:     Right lower leg: No edema.     Left lower leg: No edema.  Skin:    General: Skin is warm and dry.     Capillary Refill: Capillary refill takes less than 2 seconds.  Neurological:     General: No focal deficit present.     Mental Status: He is alert.     Labs on Admission: I have personally reviewed following labs and imaging studies  CBC: Recent Labs  Lab 02/21/21 0903  WBC 17.5*  HGB 14.6  HCT 46.7  MCV 82.7  PLT 210   Basic Metabolic Panel: Recent Labs  Lab 02/21/21 1016  NA 136  K 3.7  CL 99  CO2 27  GLUCOSE 274*  BUN 13  CREATININE 1.21  CALCIUM 8.6*   GFR: Estimated Creatinine Clearance: 85 mL/min (by C-G formula based on SCr of 1.21 mg/dL). Liver Function Tests: Recent Labs  Lab 02/21/21 1016  AST 15  ALT 13  ALKPHOS 38  BILITOT 2.4*  PROT 7.1  ALBUMIN 3.8   Recent Labs  Lab 02/21/21 1016  LIPASE 28   No results for input(s): AMMONIA in the last 168 hours. Coagulation Profile: No results for input(s): INR, PROTIME in the last 168 hours. Cardiac Enzymes: No results for input(s): CKTOTAL, CKMB, CKMBINDEX, TROPONINI in the last 168 hours. BNP (last 3 results) No results for input(s): PROBNP in the last 8760 hours. HbA1C: No results for input(s): HGBA1C in the last 72 hours. CBG: No results for input(s): GLUCAP in the last 168 hours. Lipid Profile: No results for input(s):  CHOL, HDL, LDLCALC, TRIG, CHOLHDL, LDLDIRECT in the last 72 hours. Thyroid Function Tests: No results for input(s): TSH, T4TOTAL, FREET4, T3FREE, THYROIDAB in the last 72 hours. Anemia Panel: No results for input(s): VITAMINB12, FOLATE, FERRITIN, TIBC, IRON, RETICCTPCT in the last 72 hours. Urine analysis:    Component Value Date/Time   COLORURINE Colorless 05/30/2011 1051   APPEARANCEUR Clear 05/30/2011 1051   LABSPEC 1.003 05/30/2011 1051   PHURINE 5.0 05/30/2011 1051   GLUCOSEU 50 mg/dL 07/30/2011 74/16/3845   HGBUR Negative 05/30/2011 1051   BILIRUBINUR  Negative 05/30/2011 1051   KETONESUR Negative 05/30/2011 1051   PROTEINUR Negative 05/30/2011 1051   NITRITE Negative 05/30/2011 1051   LEUKOCYTESUR Negative 05/30/2011 1051    Radiological Exams on Admission: I have personally reviewed images CT ABDOMEN PELVIS W CONTRAST  Result Date: 02/21/2021 CLINICAL DATA:  31 year old male with abdominal pain and vomiting for 2 days. EXAM: CT ABDOMEN AND PELVIS WITH CONTRAST TECHNIQUE: Multidetector CT imaging of the abdomen and pelvis was performed using the standard protocol following bolus administration of intravenous contrast. RADIATION DOSE REDUCTION: This exam was performed according to the departmental dose-optimization program which includes automated exposure control, adjustment of the mA and/or kV according to patient size and/or use of iterative reconstruction technique. CONTRAST:  OMNIPAQUE IOHEXOL 300 MG/ML  SOLN COMPARISON:  Abdominal radiograph 09/26/2018. FINDINGS: Lower chest: Low lung volumes with mild lung base atelectasis. No pericardial or pleural effusion. Hepatobiliary: Negative. Pancreas: Negative. Spleen: Negative. Adrenals/Urinary Tract: Negative. Stomach/Bowel: Retained stool in the rectum. Redundant sigmoid Colon with more gas than retained stool. Intermittent decompressed descending Colon. Similar retained gas and stool in the transverse Colon. Mostly fluid in the right  Colon which is nondilated. Evidence that the appendix arises medially from the cecum on series 2, image 51, and tracks across midline to the left. In the mid and distal appendix 2 elongated appendicoliths measure up to 15 mm in length and 7 mm thickness. The tip of the appendix is indistinct and except for added space the appendix appears mildly inflamed. Appendix: Location: In the midline and tracking across midline to the left just below the level of the umbilicus (coronal image 31). Diameter: Up to 14 mm, abnormal (series 2, image 55). Appendicolith: Two elongated appendicoliths measure up to 15 mm in length and 7 mm thickness located in the mid to distal appendix. Mucosal hyper-enhancement: Minimal Extraluminal gas: Negative Periappendiceal collection: Negative. Negative stomach and duodenum. No dilated small bowel. Fluid-filled but nondilated terminal ileum and distal small bowel. No free air or free fluid identified. Vascular/Lymphatic: Major vascular structures in the abdomen and pelvis appear to be patent, normal. No lymphadenopathy identified. Reproductive: Negative. Other: No pelvic free fluid. Musculoskeletal: Negative. IMPRESSION: Positive for Acute Appendicitis. Note appendix tracks across midline into the LEFT mid abdomen and contains two sizable appendicoliths. Dilated and mildly inflamed appendix, with no evidence of perforation or abscess. Electronically Signed   By: Odessa Fleming M.D.   On: 02/21/2021 11:23    EKG: I have personally reviewed EKG: no EKG performed  Assessment/Plan Principal Problem:   Acute appendicitis Active Problems:   Type 1 diabetes ([facility])   Seizure disorder ([facility])   Hypothyroidism (acquired)   Autism spectrum disorder    Acute appendicitis Pt being admitted by general surgery service. Management per surgery team.  Type 1 diabetes ([facility]) Pt takes 70/30 mix novolog at home. Verified with pt's legal guardian Francisco Colon that pt did receive his insulin injection this  AM. Add SSI. Change over to lantus while hospitalized.  Seizure disorder [facility]) Verified with pt's legal guardian that pt took his keppra dose(500 mg) this AM. Change over to IV keppra while npo. Can change back to po keppra when taking clear liquids or other advanced diet.  Autism spectrum disorder chronic  Hypothyroidism (acquired) Stable. Continue synthroid when able to take po liquids. No need for IV synthroid at this time unless NPO > 48 hours.   Carollee Herter, DO [facility] 02/21/2021, 12:28 PM

## 2021-02-21 NOTE — Assessment & Plan Note (Signed)
Pt takes 70/30 mix novolog at home. Verified with pt's legal guardian [name] that pt did receive his insulin injection this AM. Add SSI. Change over to lantus while hospitalized.

## 2021-02-22 ENCOUNTER — Encounter: Payer: Self-pay | Admitting: General Surgery

## 2021-02-22 ENCOUNTER — Observation Stay: Payer: Medicare Other | Admitting: Registered Nurse

## 2021-02-22 ENCOUNTER — Other Ambulatory Visit: Payer: Self-pay

## 2021-02-22 DIAGNOSIS — E109 Type 1 diabetes mellitus without complications: Secondary | ICD-10-CM | POA: Diagnosis present

## 2021-02-22 DIAGNOSIS — Z833 Family history of diabetes mellitus: Secondary | ICD-10-CM | POA: Diagnosis not present

## 2021-02-22 DIAGNOSIS — K3580 Unspecified acute appendicitis: Secondary | ICD-10-CM | POA: Diagnosis not present

## 2021-02-22 DIAGNOSIS — J9811 Atelectasis: Secondary | ICD-10-CM | POA: Diagnosis present

## 2021-02-22 DIAGNOSIS — Z79890 Hormone replacement therapy (postmenopausal): Secondary | ICD-10-CM | POA: Diagnosis not present

## 2021-02-22 DIAGNOSIS — Z801 Family history of malignant neoplasm of trachea, bronchus and lung: Secondary | ICD-10-CM | POA: Diagnosis not present

## 2021-02-22 DIAGNOSIS — Z8249 Family history of ischemic heart disease and other diseases of the circulatory system: Secondary | ICD-10-CM | POA: Diagnosis not present

## 2021-02-22 DIAGNOSIS — Z79899 Other long term (current) drug therapy: Secondary | ICD-10-CM | POA: Diagnosis not present

## 2021-02-22 DIAGNOSIS — F840 Autistic disorder: Secondary | ICD-10-CM | POA: Diagnosis present

## 2021-02-22 DIAGNOSIS — E039 Hypothyroidism, unspecified: Secondary | ICD-10-CM | POA: Diagnosis present

## 2021-02-22 DIAGNOSIS — G40909 Epilepsy, unspecified, not intractable, without status epilepticus: Secondary | ICD-10-CM | POA: Diagnosis present

## 2021-02-22 DIAGNOSIS — R1084 Generalized abdominal pain: Secondary | ICD-10-CM | POA: Diagnosis present

## 2021-02-22 DIAGNOSIS — K381 Appendicular concretions: Secondary | ICD-10-CM | POA: Diagnosis present

## 2021-02-22 DIAGNOSIS — K353 Acute appendicitis with localized peritonitis, without perforation or gangrene: Secondary | ICD-10-CM | POA: Diagnosis present

## 2021-02-22 DIAGNOSIS — K389 Disease of appendix, unspecified: Secondary | ICD-10-CM | POA: Diagnosis present

## 2021-02-22 DIAGNOSIS — Z794 Long term (current) use of insulin: Secondary | ICD-10-CM | POA: Diagnosis not present

## 2021-02-22 DIAGNOSIS — Z20822 Contact with and (suspected) exposure to covid-19: Secondary | ICD-10-CM | POA: Diagnosis present

## 2021-02-22 LAB — CBC WITH DIFFERENTIAL/PLATELET
Abs Immature Granulocytes: 0.13 10*3/uL — ABNORMAL HIGH (ref 0.00–0.07)
Basophils Absolute: 0.1 10*3/uL (ref 0.0–0.1)
Basophils Relative: 0 %
Eosinophils Absolute: 0 10*3/uL (ref 0.0–0.5)
Eosinophils Relative: 0 %
HCT: 45 % (ref 39.0–52.0)
Hemoglobin: 14.6 g/dL (ref 13.0–17.0)
Immature Granulocytes: 1 %
Lymphocytes Relative: 4 %
Lymphs Abs: 0.8 10*3/uL (ref 0.7–4.0)
MCH: 25.2 pg — ABNORMAL LOW (ref 26.0–34.0)
MCHC: 32.4 g/dL (ref 30.0–36.0)
MCV: 77.7 fL — ABNORMAL LOW (ref 80.0–100.0)
Monocytes Absolute: 0.4 10*3/uL (ref 0.1–1.0)
Monocytes Relative: 2 %
Neutro Abs: 16.4 10*3/uL — ABNORMAL HIGH (ref 1.7–7.7)
Neutrophils Relative %: 93 %
Platelets: 227 10*3/uL (ref 150–400)
RBC: 5.79 MIL/uL (ref 4.22–5.81)
RDW: 12.6 % (ref 11.5–15.5)
WBC: 17.8 10*3/uL — ABNORMAL HIGH (ref 4.0–10.5)
nRBC: 0 % (ref 0.0–0.2)

## 2021-02-22 LAB — COMPREHENSIVE METABOLIC PANEL
ALT: 16 U/L (ref 0–44)
AST: 27 U/L (ref 15–41)
Albumin: 4.3 g/dL (ref 3.5–5.0)
Alkaline Phosphatase: 49 U/L (ref 38–126)
Anion gap: 12 (ref 5–15)
BUN: 18 mg/dL (ref 6–20)
CO2: 27 mmol/L (ref 22–32)
Calcium: 8.9 mg/dL (ref 8.9–10.3)
Chloride: 101 mmol/L (ref 98–111)
Creatinine, Ser: 1.31 mg/dL — ABNORMAL HIGH (ref 0.61–1.24)
GFR, Estimated: >60 mL/min (ref >60)
Glucose, Bld: 227 mg/dL — ABNORMAL HIGH (ref 70–99)
Potassium: 3.6 mmol/L (ref 3.5–5.1)
Sodium: 140 mmol/L (ref 135–145)
Total Bilirubin: 1.9 mg/dL — ABNORMAL HIGH (ref 0.3–1.2)
Total Protein: 8 g/dL (ref 6.5–8.1)

## 2021-02-22 LAB — GLUCOSE, CAPILLARY
Glucose-Capillary: 185 mg/dL — ABNORMAL HIGH (ref 70–99)
Glucose-Capillary: 286 mg/dL — ABNORMAL HIGH (ref 70–99)
Glucose-Capillary: 226 mg/dL — ABNORMAL HIGH (ref 70–99)
Glucose-Capillary: 364 mg/dL — ABNORMAL HIGH (ref 70–99)

## 2021-02-22 MED ORDER — ROCURONIUM BROMIDE 100 MG/10ML IV SOLN
INTRAVENOUS | Status: DC | PRN
  Administered 2021-02-22: 30 mg via INTRAVENOUS

## 2021-02-22 MED ORDER — PHENYLEPHRINE 40 MCG/ML (10ML) SYRINGE FOR IV PUSH (FOR BLOOD PRESSURE SUPPORT)
PREFILLED_SYRINGE | INTRAVENOUS | Status: DC | PRN
  Administered 2021-02-22: 80 ug via INTRAVENOUS

## 2021-02-22 MED ORDER — ONDANSETRON HCL 4 MG/2ML IJ SOLN
INTRAMUSCULAR | Status: AC
  Filled 2021-02-22: qty 2

## 2021-02-22 MED ORDER — SODIUM CHLORIDE 0.9 % IV SOLN: INTRAVENOUS | Status: DC | PRN

## 2021-02-22 MED ORDER — SUGAMMADEX SODIUM 500 MG/5ML IV SOLN
INTRAVENOUS | Status: AC
  Filled 2021-02-22: qty 5

## 2021-02-22 MED ORDER — FENTANYL CITRATE (PF) 100 MCG/2ML IJ SOLN
INTRAMUSCULAR | Status: DC | PRN
Start: 2021-02-22 — End: 2021-02-22
  Administered 2021-02-22: 100 ug via INTRAVENOUS
  Administered 2021-02-22: 50 ug via INTRAVENOUS

## 2021-02-22 MED ORDER — FENTANYL CITRATE (PF) 100 MCG/2ML IJ SOLN: 25.0000 ug | INTRAMUSCULAR | Status: DC | PRN

## 2021-02-22 MED ORDER — ACETAMINOPHEN 10 MG/ML IV SOLN
INTRAVENOUS | Status: AC
  Filled 2021-02-22: qty 100

## 2021-02-22 MED ORDER — SUCCINYLCHOLINE CHLORIDE 200 MG/10ML IV SOSY
PREFILLED_SYRINGE | INTRAVENOUS | Status: DC | PRN
Start: 2021-02-22 — End: 2021-02-22
  Administered 2021-02-22: 100 mg via INTRAVENOUS

## 2021-02-22 MED ORDER — DEXAMETHASONE SODIUM PHOSPHATE 10 MG/ML IJ SOLN
INTRAMUSCULAR | Status: AC
  Filled 2021-02-22: qty 1

## 2021-02-22 MED ORDER — OXYCODONE HCL 5 MG PO TABS: 5.0000 mg | ORAL_TABLET | Freq: Once | ORAL | Status: DC | PRN

## 2021-02-22 MED ORDER — SUGAMMADEX SODIUM 200 MG/2ML IV SOLN
INTRAVENOUS | Status: DC | PRN
  Administered 2021-02-22: 200 mg via INTRAVENOUS

## 2021-02-22 MED ORDER — ACETAMINOPHEN 10 MG/ML IV SOLN: 1000.0000 mg | Freq: Once | INTRAVENOUS | Status: DC | PRN

## 2021-02-22 MED ORDER — SUCCINYLCHOLINE CHLORIDE 200 MG/10ML IV SOSY
PREFILLED_SYRINGE | INTRAVENOUS | Status: AC
  Filled 2021-02-22: qty 10

## 2021-02-22 MED ORDER — MIDAZOLAM HCL 2 MG/2ML IJ SOLN
INTRAMUSCULAR | Status: DC | PRN
  Administered 2021-02-22: 2 mg via INTRAVENOUS

## 2021-02-22 MED ORDER — EPHEDRINE 5 MG/ML INJ
INTRAVENOUS | Status: AC
  Filled 2021-02-22: qty 5

## 2021-02-22 MED ORDER — OXYCODONE HCL 5 MG/5ML PO SOLN: 5.0000 mg | Freq: Once | ORAL | Status: DC | PRN

## 2021-02-22 MED ORDER — ONDANSETRON HCL 4 MG/2ML IJ SOLN: 4.0000 mg | Freq: Once | INTRAMUSCULAR | Status: DC | PRN

## 2021-02-22 MED ORDER — ROCURONIUM BROMIDE 10 MG/ML (PF) SYRINGE
PREFILLED_SYRINGE | INTRAVENOUS | Status: AC
  Filled 2021-02-22: qty 10

## 2021-02-22 MED ORDER — INSULIN ASPART 100 UNIT/ML IJ SOLN
INTRAMUSCULAR | Status: AC
  Filled 2021-02-22: qty 1

## 2021-02-22 MED ORDER — SODIUM CHLORIDE 0.9 % IR SOLN
Status: DC | PRN
Start: 2021-02-22 — End: 2021-02-22
  Administered 2021-02-22: 200 mL

## 2021-02-22 MED ORDER — LEVOTHYROXINE SODIUM 112 MCG PO TABS: 112.0000 ug | ORAL_TABLET | Freq: Every day | ORAL | Status: DC

## 2021-02-22 MED ORDER — INSULIN GLARGINE-YFGN 100 UNIT/ML ~~LOC~~ SOLN
16.0000 [IU] | Freq: Every day | SUBCUTANEOUS | Status: DC
  Filled 2021-02-22: qty 0.16

## 2021-02-22 MED ORDER — BUPIVACAINE-EPINEPHRINE (PF) 0.5% -1:200000 IJ SOLN
INTRAMUSCULAR | Status: DC | PRN
  Administered 2021-02-22: 18 mL
  Administered 2021-02-22: 12 mL

## 2021-02-22 MED ORDER — KETOROLAC TROMETHAMINE 30 MG/ML IJ SOLN
INTRAMUSCULAR | Status: DC | PRN
  Administered 2021-02-22: 30 mg via INTRAVENOUS

## 2021-02-22 MED ORDER — ACETAMINOPHEN 10 MG/ML IV SOLN
INTRAVENOUS | Status: DC | PRN
  Administered 2021-02-22: 1000 mg via INTRAVENOUS

## 2021-02-22 MED ORDER — LIDOCAINE HCL (PF) 2 % IJ SOLN
INTRAMUSCULAR | Status: AC
  Filled 2021-02-22: qty 5

## 2021-02-22 MED ORDER — PROPOFOL 10 MG/ML IV BOLUS
INTRAVENOUS | Status: DC | PRN
  Administered 2021-02-22: 150 mg via INTRAVENOUS

## 2021-02-22 MED ORDER — FENTANYL CITRATE (PF) 100 MCG/2ML IJ SOLN
INTRAMUSCULAR | Status: AC
  Filled 2021-02-22: qty 2

## 2021-02-22 MED ORDER — KETOROLAC TROMETHAMINE 30 MG/ML IJ SOLN
INTRAMUSCULAR | Status: AC
  Filled 2021-02-22: qty 1

## 2021-02-22 MED ORDER — ONDANSETRON HCL 4 MG/2ML IJ SOLN
INTRAMUSCULAR | Status: DC | PRN
  Administered 2021-02-22: 4 mg via INTRAVENOUS

## 2021-02-22 MED ORDER — DEXAMETHASONE SODIUM PHOSPHATE 10 MG/ML IJ SOLN
INTRAMUSCULAR | Status: DC | PRN
  Administered 2021-02-22: 10 mg via INTRAVENOUS

## 2021-02-22 MED ORDER — 0.9 % SODIUM CHLORIDE (POUR BTL) OPTIME
TOPICAL | Status: DC | PRN
  Administered 2021-02-22: 10 mL

## 2021-02-22 MED ORDER — LEVETIRACETAM 500 MG PO TABS
500.0000 mg | ORAL_TABLET | Freq: Two times a day (BID) | ORAL | Status: DC
  Filled 2021-02-22: qty 1

## 2021-02-22 NOTE — Discharge Instructions (Signed)

## 2021-02-22 NOTE — Transfer of Care (Signed)
Immediate Anesthesia Transfer of Care Note  Patient: Francisco Colon  Procedure(s) Performed: XI ROBOTIC LAPAROSCOPIC ASSISTED APPENDECTOMY (Abdomen)  Patient Location: PACU  Anesthesia Type:General  Level of Consciousness: sedated and patient cooperative  Airway & Oxygen Therapy: Patient Spontanous Breathing  Post-op Assessment: Report given to RN and Post -op Vital signs reviewed and stable  Post vital signs: Reviewed and stable  Last Vitals:  Vitals Value Taken Time  BP    Temp    Pulse 109 02/22/21 0210  Resp 27 02/22/21 0210  SpO2 98 % 02/22/21 0210  Vitals shown include unvalidated device data.  Last Pain:  Vitals:   02/21/21 1931  TempSrc:   PainSc: 5          Complications: No notable events documented.

## 2021-02-22 NOTE — Progress Notes (Signed)
Consult note follow-up  31 year old black male Known history of type 1 diabetes mellitus, autism spectrum disorder Prior DKA 09/27/2018 ?  Hypothyroidism  Presented to emergency room with left-sided lower quadrant pain-he was a poor historian-he was found to have 2 appendicoliths-he underwent appendectomy under Dr. Windell Moment 2/1 early morning Hospital medicine consult was requested secondary to his seizure disorder diabetes mellitus  On exam BP 134/87 (BP Location: Left Arm)    Pulse 95    Temp 98 F (36.7 C) (Oral)    Resp 19    Ht 5\' 7"  (1.702 m)    Wt 77.1 kg    SpO2 100%    BMI 26.63 kg/m   Awake coherent quite anxious no distress S1-S2 no murmur no rub no gallop Chest clear Neck soft Abdomen nontender no rebound no guarding  Review of labs Wbc 17.8, Heme 14, PLT 227 Bun/creat slight ^ 18/1.31  P  Appendicolith Status post appendectomy 2/1 AM Per general surgery-antibiotics to be narrowed as per them Pain control Norco first choice, morphine second choice or as per surgeon Dm ty 1 diagnosed many years ago Increase Lantus from 10 -->  16 units today continue SSI coverage Autism spectrum disorder + seizure disorder Continue Keppra but changed to p.o. formulation is taking p.o. we will continue to follow  I briefly updated the patient's legal guardian as below [place]     Verneita Griffes, MD [facility] 12:38 PM

## 2021-02-22 NOTE — Progress Notes (Signed)
Patient was not given his dose of SEMGLEE insulin 10 units due to it being placed on hold for surgery. His insulin aspart will continue as normal with his next blood glucose check at 4 am. Patient is content at this time with no complaints of pain after surgery. Patient vital signs are stable and patient is resting in bed. No signs or symptoms of hypo/hyperglycemia at this time.

## 2021-02-22 NOTE — Hospital Course (Signed)
Consult note follow-up  31 year old black male Known history of type 1 diabetes mellitus, autism spectrum disorder Prior DKA 09/27/2018 ?  Hypothyroidism  Presented to emergency room with left-sided lower quadrant pain-he was a poor historian-he was found to have 2 appendicoliths-he underwent appendectomy under Dr. Windell Moment 2/1 early morning Hospital medicine consult was requested secondary to his seizure disorder diabetes mellitus  On exam BP 134/87 (BP Location: Left Arm)    Pulse 95    Temp 98 F (36.7 C) (Oral)    Resp 19    Ht 5\' 7"  (1.702 m)    Wt 77.1 kg    SpO2 100%    BMI 26.63 kg/m   Awake coherent quite anxious no distress S1-S2 no murmur no rub no gallop Chest clear Neck soft Abdomen nontender no rebound no guarding  Review of labs Wbc 17.8, Heme 14, PLT 227 Bun/creat slight ^ 18/1.31  P  Appendicolith Status post appendectomy 2/1 AM Per general surgery-antibiotics to be narrowed as per them Pain control Norco first choice, morphine second choice or as per surgeon Dm ty 1 diagnosed many years ago Increase Lantus from 10 -->  16 units today continue SSI coverage Autism spectrum disorder + seizure disorder Continue Keppra but changed to p.o. formulation is taking p.o. we will continue to follow  I briefly updated the patient's legal guardian as below [place]     Verneita Griffes, MD [facility] 12:38 PM

## 2021-02-22 NOTE — Progress Notes (Signed)
Inpatient Diabetes Program Recommendations  AACE/ADA: New Consensus Statement on Inpatient Glycemic Control (2015)  Target Ranges:  Prepandial:   less than 140 mg/dL      Peak postprandial:   less than 180 mg/dL (1-2 hours)      Critically ill patients:  140 - 180 mg/dL    Latest Reference Range & Units 02/21/21 16:14 02/21/21 20:34 02/22/21 02:05 02/22/21 05:18 02/22/21 07:52  Glucose-Capillary 70 - 99 mg/dL 939 (H)  3 units Novolog  156 (H)  2 units Novolog @2203  185 (H)    10 mg Decadron @0039  286 (H)  5 units Novolog  226 (H)  (H): Data is abnormally high     History: DM1 (makes NO insulin; requires basal, correction, and meal coverage insulin)  Home DM Meds: 70/30 Insulin 22 units QAM/ 30 units QPM  Current Orders: Semglee 10 units QHS     Novolog Sensitive Correction Scale/ SSI (0-9 units) Q4 hours     MD- Note pt did NOT get Semglee insulin last PM due to sugery  Has Type 1 diabetes and will need basal insulin on board today since he did not get it last PM  Please consider:  Increase the Semglee to 18 units Daily (50% of the total basal insulin pt gets in his 2 doses of 70/30 insulin at home)  Please give the Semglee this AM     NOTE: Patient in ED with nausea, vomiting, left sided pain. In reviewing chart, noted patient sees Dr. (Endocrinologist) and last seen 08/08/20. Per office note on 08/08/20, patient with "autism and type 1 diabetes. He was last seen in 01/2020. He did not show for follow up appts in 04/2020 or 05/2020. He has a learning disability and is dependent on relatives to bring him to appts.Lavonne lives at home with his grandmother 05/2020) and his uncle 06/2020. Uncle Corrie Dandy lives across the street and aunt Kendal Hymen lives next door. Uncle Bonnie administers Len[place]'s insulin typically but sometimes [place] does this as well. Okey Regal and Judi Saa are involved with reviewing the blood glucose readings and refilling his medications.He takes  NovoLog 70/30 insulin 20 units before breakfast and 30 units before supper plus extra 4 units per 50 that sugar is over 200 mg/dl."    --Will follow patient during hospitalization--  Corrie Dandy RN, MSN, CDE Diabetes Coordinator Inpatient Glycemic Control Team Team Pager: [phone] (8a-5p)

## 2021-02-22 NOTE — Op Note (Signed)
Pre-op Diagnosis: Acute appendicitis   Post op Diagnosis: Acute appenditicis  Procedure: Robotic assisted laparoscopic appendectomy.  Anesthesia: GETA  Surgeon: Carolan Shiver, MD, FACS  Wound Classification: Contaminated  Specimen: Appendix  Complications: None  Estimated Blood Loss: 3 mL   Indications: Patient is a 31 y.o. male  presented with above right lower quadrant pain. CT scan shows acute appendicitis.     FIndings: 1.  Suppurative appendix with appendicolith on left lower quadrant 2. No peri-appendiceal abscess or phlegmon 3. Normal anatomy 4. Adequate hemostasis.     Description of procedure: The patient was placed on the operating table in the supine position. General anesthesia was induced. A time-out was completed verifying correct patient, procedure, site, positioning, and implant(s) and/or special equipment prior to beginning this procedure. The abdomen was prepped and draped in the usual sterile fashion.   Palmer's point located and Veress needle was inserted.  After confirming 2 clicks and a positive saline drop test, gas insufflation was initiated until the abdominal pressure was measured at 15 mmHg.  Afterwards, the Veress needle was removed and a 8 mm port was placed in left upper quadrant area using Optiview technique.  After local was infused, 3 additional incision on the left abdominal wall were made 5 cm apart.  An 12 mm port and two other 8 mm ports were placed under direct visualization.  No injuries from trocar placements were noted.  The table was placed in the Trendelenburg position with the right side elevated.  With the use of Tip up grasper, Fenestrated Bipolar and Vessel sealer, The sigmoid colon was mobilized. The appendix was identified under the sigmoid colon attached to the mesentery of the sigmoid. The appendix was elevated.  Window created at base of appendix in the mesentery.    An  blue load linear cutting stapler was then used to  divide and staple the base of the appendix. Mesoappendix was divided with Vessel sealer. The appendix was placed in an endoscopic retrieval bag and removed.   The appendiceal stump was examined and hemostasis noted. No other pathology was identified within pelvis. The 12 mm trocar removed and port site closed with PMI using 0 vicryl under direct vision. Remaining trocars were removed under direct vision. No bleeding was noted.The abdomen was allowed to collapse.  All skin incisions then closed with subcuticular sutures Monocryl 4-0.  Wounds then dressed with dermabond.  The patient tolerated the procedure well, awakened from anesthesia and was taken to the postanesthesia care unit in satisfactory condition.  Sponge count and instrument count correct at the end of the procedure.

## 2021-02-22 NOTE — Anesthesia Procedure Notes (Signed)
Procedure Name: Intubation Date/Time: 02/22/2021 12:24 AM Performed by: Waldo Laine, CRNA Pre-anesthesia Checklist: Patient identified, Emergency Drugs available, Suction available and Patient being monitored Patient Re-evaluated:Patient Re-evaluated prior to induction Oxygen Delivery Method: Circle system utilized Preoxygenation: Pre-oxygenation with 100% oxygen Induction Type: IV induction and Rapid sequence Laryngoscope Size: McGraph and 3 Tube type: Oral Number of attempts: 1 Airway Equipment and Method: Stylet and Oral airway Placement Confirmation: ETT inserted through vocal cords under direct vision, positive ETCO2 and breath sounds checked- equal and bilateral Secured at: 22 cm Tube secured with: Tape Dental Injury: Teeth and Oropharynx as per pre-operative assessment

## 2021-02-22 NOTE — TOC Initial Note (Signed)
Transition of Care [facility] - Initial/Assessment Note    Patient Details  Name: Francisco Colon MRN: [id number] Date of Birth: 11/02/1990  Transition of Care [facility]) CM/SW Contact:    Marlowe Sax, RN Phone Number: 02/22/2021, 1:27 PM  Clinical Narrative:      Transition of Care (TOC) Screening Note   Patient Details  Name: Francisco Colon Date of Birth: [birth year]   Transition of Care [facility]) CM/SW Contact:    Marlowe Sax, RN Phone Number: 02/22/2021, 1:27 PM    Transition of Care Department [facility]) has reviewed patient and no TOC needs have been identified at this time. We will continue to monitor patient advancement through interdisciplinary progression rounds. If new patient transition needs arise, please place a TOC consult.                      Patient Goals and CMS Choice        Expected Discharge Plan and Services                                                Prior Living Arrangements/Services                       Activities of Daily Living      Permission Sought/Granted                  Emotional Assessment              Admission diagnosis:  Acute appendicitis with localized peritonitis [K35.30] Acute appendicitis, unspecified acute appendicitis type [K35.80] Appendicolith [K38.9] Patient Active Problem List   Diagnosis Date Noted   Appendicolith 02/22/2021   Acute appendicitis 02/21/2021   Acute appendicitis with localized peritonitis 02/21/2021   Type 1 diabetes (HCC)    Hypothyroidism (acquired)    Seizure disorder (HCC)    Autism spectrum disorder    Type 1 diabetes mellitus (HCC) 07/27/2011   Active autistic disorder 04/27/2011   Acquired hypothyroidism 04/27/2011   Seizure (HCC) 04/27/2011   Sickle cell trait (HCC) 04/27/2011   PCP:  Rayetta Humphrey, MD Pharmacy:   [facility] [address] [place] - [address] [place] [address] Phone: [phone] Fax:  [phone]  [facility] [phone] [place], [place] - [address] [place] [address] Phone: [phone] Fax: [phone]  [id number] [place], [place] - [address] [facility] [address] [place] [address] Phone: [phone] Fax: [phone]     Social Determinants of Health (SDOH) Interventions    Readmission Risk Interventions No flowsheet data found.

## 2021-02-22 NOTE — Discharge Summary (Signed)
°  Patient ID: Francisco Colon MRN: [id number] DOB/AGE: 19-Jun-1990 31 y.o.  Admit date: 02/21/2021 Discharge date: 02/22/2021   Discharge Diagnoses:  Principal Problem:   Acute appendicitis Active Problems:   Type 1 diabetes (HCC)   Hypothyroidism (acquired)   Seizure disorder (HCC)   Autism spectrum disorder   Acute appendicitis with localized peritonitis   Appendicolith   Procedures: Robotic assisted laparoscopic appendectomy  Hospital Course: Patient radiculopathy sided.  He underwent robotic assisted laparoscopic appendectomy.  This morning patient doing well.  He is tolerating diet.  He is ambulating.  Pain control.  Wounds dry and clean.  I was able to update the guardian, his uncle and notified about the great progress.  I notified about discharge today.  Ample time was given to answer all the questions.  Physical Exam HENT:     Head: Normocephalic.  Cardiovascular:     Rate and Rhythm: Normal rate and regular rhythm.  Pulmonary:     Effort: Pulmonary effort is normal.  Abdominal:     General: Abdomen is flat. Bowel sounds are normal.     Palpations: Abdomen is soft.  Skin:    General: Skin is warm.  Neurological:     Mental Status: He is alert and oriented to person, place, and time.     Consults: Hospitalist  Disposition: Discharge disposition: 01-Home or Self Care       Discharge Instructions     Diet - low sodium heart healthy   Complete by: As directed    Increase activity slowly   Complete by: As directed       Allergies as of 02/22/2021   No Known Allergies      Medication List     TAKE these medications    Glucagon 1 MG/0.2ML Soaj Inject 1 mg into the skin as needed (hypoglycemic emergency).   levETIRAcetam 500 MG tablet Commonly known as: KEPPRA Take 500 mg by mouth 2 (two) times daily.   levothyroxine 112 MCG tablet Commonly known as: SYNTHROID Take 112-168 mcg by mouth daily before breakfast.   NovoLOG Mix 70/30 (70-30) 100  UNIT/ML injection Generic drug: insulin aspart protamine- aspart Inject 22-30 Units into the skin See admin instructions. Inject 22u under the skin every morning and inject 30u under the skin every evening before meals   ondansetron 4 MG disintegrating tablet Commonly known as: Zofran ODT Take 1 tablet (4 mg total) by mouth every 8 (eight) hours as needed.        Follow-up Information     Francisco Shiver, MD Follow up in 2 week(s).   Specialty: General Surgery Why: follow up after appendectomy Contact information: [address] [place] [address] [phone]

## 2021-02-22 NOTE — Progress Notes (Signed)
Discharge instructions given to legal guardian Francisco Colon. He verbalized understanding  of discharge instructions.

## 2021-02-22 NOTE — Anesthesia Postprocedure Evaluation (Signed)
Anesthesia Post Note  Patient: Francisco Colon  Procedure(s) Performed: XI ROBOTIC LAPAROSCOPIC ASSISTED APPENDECTOMY (Abdomen)  Patient location during evaluation: PACU Anesthesia Type: General Level of consciousness: awake and alert Pain management: pain level controlled Vital Signs Assessment: post-procedure vital signs reviewed and stable Respiratory status: spontaneous breathing, nonlabored ventilation, respiratory function stable and patient connected to nasal cannula oxygen Cardiovascular status: blood pressure returned to baseline and stable Postop Assessment: no apparent nausea or vomiting Anesthetic complications: no   No notable events documented.   Last Vitals:  Vitals:   02/22/21 0300 02/22/21 0307  BP:  122/84  Pulse:  100  Resp: 18 20  Temp:  (!) 36.4 C  SpO2:  96%    Last Pain:  Vitals:   02/22/21 0307  TempSrc: Oral  PainSc:                  Corinda Gubler

## 2021-02-23 LAB — HEMOGLOBIN A1C
Hgb A1c MFr Bld: 8.6 % — ABNORMAL HIGH (ref 4.8–5.6)
Mean Plasma Glucose: 200 mg/dL

## 2021-02-23 LAB — SURGICAL PATHOLOGY

## 2021-05-23 ENCOUNTER — Ambulatory Visit (INDEPENDENT_AMBULATORY_CARE_PROVIDER_SITE_OTHER): Payer: Medicare Other | Admitting: Podiatry

## 2021-05-23 DIAGNOSIS — M79675 Pain in left toe(s): Secondary | ICD-10-CM | POA: Diagnosis not present

## 2021-05-23 DIAGNOSIS — B351 Tinea unguium: Secondary | ICD-10-CM

## 2021-05-23 DIAGNOSIS — M79674 Pain in right toe(s): Secondary | ICD-10-CM

## 2021-05-23 NOTE — Progress Notes (Signed)
? ?  SUBJECTIVE Patient presents to office today complaining of elongated, thickened nails that cause pain while ambulating in shoes.  Patient is unable to trim their own nails. Patient is here for further evaluation and treatment.  Past Medical History:  Diagnosis Date   Autism spectrum disorder    DKA (diabetic ketoacidoses) 09/26/2018   Hypothyroidism    Seizure (HCC) 2012   hypoglycemia induced - s/p eval by neuro   Thyroid nodule    by prior US   Type 1 diabetes (HCC) 2000    OBJECTIVE General Patient is awake, alert, and oriented x 3 and in no acute distress. Derm Skin is dry and supple bilateral. Negative open lesions or macerations. Remaining integument unremarkable. Nails are tender, long, thickened and dystrophic with subungual debris, consistent with onychomycosis, 1-5 bilateral. No signs of infection noted. Vasc  DP and PT pedal pulses palpable bilaterally. Temperature gradient within normal limits.  Neuro Epicritic and protective threshold sensation grossly intact bilaterally.  Musculoskeletal Exam No symptomatic pedal deformities noted bilateral. Muscular strength within normal limits.  ASSESSMENT 1.  Pain due to onychomycosis of toenails both  PLAN OF CARE 1. Patient evaluated today.  2. Instructed to maintain good pedal hygiene and foot care.  3. Mechanical debridement of nails 1-5 bilaterally performed using a nail nipper. Filed with dremel without incident.  4. Return to clinic in 3 mos.    Marthe Dant M. Kaarin Pardy, DPM [facility]  Dr. Jailin Moomaw M. Palmina Clodfelter, DPM    [address]                                     [place], [place] [address]                Office [phone]  Fax [phone]    

## 2021-08-22 ENCOUNTER — Ambulatory Visit (INDEPENDENT_AMBULATORY_CARE_PROVIDER_SITE_OTHER): Payer: Medicare Other | Admitting: Podiatry

## 2021-08-22 DIAGNOSIS — B351 Tinea unguium: Secondary | ICD-10-CM

## 2021-08-22 DIAGNOSIS — M79674 Pain in right toe(s): Secondary | ICD-10-CM

## 2021-08-22 DIAGNOSIS — M79675 Pain in left toe(s): Secondary | ICD-10-CM | POA: Diagnosis not present

## 2021-08-22 NOTE — Progress Notes (Signed)
   SUBJECTIVE Patient presents to office today complaining of elongated, thickened nails that cause pain while ambulating in shoes.  Patient is unable to trim their own nails. Patient is here for further evaluation and treatment.  Past Medical History:  Diagnosis Date   Autism spectrum disorder    DKA (diabetic ketoacidoses) 09/26/2018   Hypothyroidism    Seizure (HCC) 2012   hypoglycemia induced - s/p eval by neuro   Thyroid nodule    by prior US   Type 1 diabetes (HCC) 2000    OBJECTIVE General Patient is awake, alert, and oriented x 3 and in no acute distress. Derm Skin is dry and supple bilateral. Negative open lesions or macerations. Remaining integument unremarkable. Nails are tender, long, thickened and dystrophic with subungual debris, consistent with onychomycosis, 1-5 bilateral. No signs of infection noted. Vasc  DP and PT pedal pulses palpable bilaterally. Temperature gradient within normal limits.  Neuro Epicritic and protective threshold sensation grossly intact bilaterally.  Musculoskeletal Exam No symptomatic pedal deformities noted bilateral. Muscular strength within normal limits.  ASSESSMENT 1.  Pain due to onychomycosis of toenails both  PLAN OF CARE 1. Patient evaluated today.  2. Instructed to maintain good pedal hygiene and foot care.  3. Mechanical debridement of nails 1-5 bilaterally performed using a nail nipper. Filed with dremel without incident.  4. Return to clinic in 3 mos.    Felecia Shelling, DPM [facility]  Dr. Felecia Shelling, DPM    [address] [place], [place] [address]                Office [phone]  Fax [phone]

## 2021-09-20 ENCOUNTER — Inpatient Hospital Stay
Admission: EM | Admit: 2021-09-20 | Discharge: 2021-09-22 | DRG: 638 | Disposition: A | Discharge status: Home / Self Care | Payer: Medicare Other | Attending: Family Medicine | Admitting: Family Medicine

## 2021-09-20 ENCOUNTER — Other Ambulatory Visit: Payer: Self-pay

## 2021-09-20 DIAGNOSIS — Z79899 Other long term (current) drug therapy: Secondary | ICD-10-CM | POA: Diagnosis not present

## 2021-09-20 DIAGNOSIS — E1010 Type 1 diabetes mellitus with ketoacidosis without coma: Secondary | ICD-10-CM | POA: Diagnosis present

## 2021-09-20 DIAGNOSIS — E860 Dehydration: Secondary | ICD-10-CM | POA: Diagnosis present

## 2021-09-20 DIAGNOSIS — G40909 Epilepsy, unspecified, not intractable, without status epilepticus: Secondary | ICD-10-CM | POA: Diagnosis present

## 2021-09-20 DIAGNOSIS — R1084 Generalized abdominal pain: Secondary | ICD-10-CM | POA: Diagnosis present

## 2021-09-20 DIAGNOSIS — Z8249 Family history of ischemic heart disease and other diseases of the circulatory system: Secondary | ICD-10-CM

## 2021-09-20 DIAGNOSIS — Z794 Long term (current) use of insulin: Secondary | ICD-10-CM

## 2021-09-20 DIAGNOSIS — E039 Hypothyroidism, unspecified: Secondary | ICD-10-CM | POA: Diagnosis present

## 2021-09-20 DIAGNOSIS — F840 Autistic disorder: Secondary | ICD-10-CM | POA: Diagnosis present

## 2021-09-20 DIAGNOSIS — Z833 Family history of diabetes mellitus: Secondary | ICD-10-CM

## 2021-09-20 DIAGNOSIS — E041 Nontoxic single thyroid nodule: Secondary | ICD-10-CM | POA: Diagnosis present

## 2021-09-20 DIAGNOSIS — Z20822 Contact with and (suspected) exposure to covid-19: Secondary | ICD-10-CM | POA: Diagnosis present

## 2021-09-20 DIAGNOSIS — Z91148 Patient's other noncompliance with medication regimen for other reason: Secondary | ICD-10-CM

## 2021-09-20 DIAGNOSIS — Z801 Family history of malignant neoplasm of trachea, bronchus and lung: Secondary | ICD-10-CM | POA: Diagnosis not present

## 2021-09-20 DIAGNOSIS — E1110 Type 2 diabetes mellitus with ketoacidosis without coma: Secondary | ICD-10-CM | POA: Insufficient documentation

## 2021-09-20 DIAGNOSIS — R001 Bradycardia, unspecified: Secondary | ICD-10-CM | POA: Diagnosis not present

## 2021-09-20 LAB — COMPREHENSIVE METABOLIC PANEL
ALT: 18 U/L (ref 0–44)
AST: 24 U/L (ref 15–41)
Albumin: 5.3 g/dL — ABNORMAL HIGH (ref 3.5–5.0)
Alkaline Phosphatase: 90 U/L (ref 38–126)
Anion gap: 22 — ABNORMAL HIGH (ref 5–15)
BUN: 42 mg/dL — ABNORMAL HIGH (ref 6–20)
CO2: 20 mmol/L — ABNORMAL LOW (ref 22–32)
Calcium: 10.8 mg/dL — ABNORMAL HIGH (ref 8.9–10.3)
Chloride: 97 mmol/L — ABNORMAL LOW (ref 98–111)
Creatinine, Ser: 1.82 mg/dL — ABNORMAL HIGH (ref 0.61–1.24)
GFR, Estimated: 51 mL/min — ABNORMAL LOW (ref >60)
Glucose, Bld: 784 mg/dL (ref 70–99)
Potassium: 5.6 mmol/L — ABNORMAL HIGH (ref 3.5–5.1)
Sodium: 139 mmol/L (ref 135–145)
Total Bilirubin: 3.8 mg/dL — ABNORMAL HIGH (ref 0.3–1.2)
Total Protein: 9.6 g/dL — ABNORMAL HIGH (ref 6.5–8.1)

## 2021-09-20 LAB — BASIC METABOLIC PANEL
Anion gap: 18 — ABNORMAL HIGH (ref 5–15)
Anion gap: 17 — ABNORMAL HIGH (ref 5–15)
BUN: 39 mg/dL — ABNORMAL HIGH (ref 6–20)
BUN: 34 mg/dL — ABNORMAL HIGH (ref 6–20)
CO2: 19 mmol/L — ABNORMAL LOW (ref 22–32)
CO2: 19 mmol/L — ABNORMAL LOW (ref 22–32)
Calcium: 9.6 mg/dL (ref 8.9–10.3)
Calcium: 9.8 mg/dL (ref 8.9–10.3)
Chloride: 106 mmol/L (ref 98–111)
Chloride: 107 mmol/L (ref 98–111)
Creatinine, Ser: 1.7 mg/dL — ABNORMAL HIGH (ref 0.61–1.24)
Creatinine, Ser: 1.57 mg/dL — ABNORMAL HIGH (ref 0.61–1.24)
GFR, Estimated: 55 mL/min — ABNORMAL LOW (ref >60)
GFR, Estimated: >60 mL/min (ref >60)
Glucose, Bld: 592 mg/dL (ref 70–99)
Glucose, Bld: 315 mg/dL — ABNORMAL HIGH (ref 70–99)
Potassium: 5.2 mmol/L — ABNORMAL HIGH (ref 3.5–5.1)
Potassium: 4.1 mmol/L (ref 3.5–5.1)
Sodium: 143 mmol/L (ref 135–145)
Sodium: 143 mmol/L (ref 135–145)

## 2021-09-20 LAB — URINALYSIS, ROUTINE W REFLEX MICROSCOPIC
Bacteria, UA: NONE SEEN
Bilirubin Urine: NEGATIVE
Glucose, UA: >=500 mg/dL — AB
Hgb urine dipstick: NEGATIVE
Ketones, ur: 80 mg/dL — AB
Leukocytes,Ua: NEGATIVE
Nitrite: NEGATIVE
Protein, ur: NEGATIVE mg/dL
Specific Gravity, Urine: 1.024 (ref 1.005–1.030)
Squamous Epithelial / HPF: NONE SEEN (ref 0–5)
pH: 5 (ref 5.0–8.0)

## 2021-09-20 LAB — CBG MONITORING, ED
Glucose-Capillary: >600 mg/dL (ref 70–99)
Glucose-Capillary: >600 mg/dL (ref 70–99)
Glucose-Capillary: 415 mg/dL — ABNORMAL HIGH (ref 70–99)
Glucose-Capillary: 319 mg/dL — ABNORMAL HIGH (ref 70–99)
Glucose-Capillary: 297 mg/dL — ABNORMAL HIGH (ref 70–99)
Glucose-Capillary: 254 mg/dL — ABNORMAL HIGH (ref 70–99)
Glucose-Capillary: 197 mg/dL — ABNORMAL HIGH (ref 70–99)

## 2021-09-20 LAB — GLUCOSE, CAPILLARY
Glucose-Capillary: 193 mg/dL — ABNORMAL HIGH (ref 70–99)
Glucose-Capillary: 185 mg/dL — ABNORMAL HIGH (ref 70–99)
Glucose-Capillary: 177 mg/dL — ABNORMAL HIGH (ref 70–99)
Glucose-Capillary: 179 mg/dL — ABNORMAL HIGH (ref 70–99)
Glucose-Capillary: 187 mg/dL — ABNORMAL HIGH (ref 70–99)
Glucose-Capillary: 166 mg/dL — ABNORMAL HIGH (ref 70–99)

## 2021-09-20 LAB — CBC
HCT: 50.1 % (ref 39.0–52.0)
Hemoglobin: 16.4 g/dL (ref 13.0–17.0)
MCH: 25.5 pg — ABNORMAL LOW (ref 26.0–34.0)
MCHC: 32.7 g/dL (ref 30.0–36.0)
MCV: 77.9 fL — ABNORMAL LOW (ref 80.0–100.0)
Platelets: 258 10*3/uL (ref 150–400)
RBC: 6.43 MIL/uL — ABNORMAL HIGH (ref 4.22–5.81)
RDW: 12.2 % (ref 11.5–15.5)
WBC: 7 10*3/uL (ref 4.0–10.5)
nRBC: 0 % (ref 0.0–0.2)

## 2021-09-20 LAB — BETA-HYDROXYBUTYRIC ACID
Beta-Hydroxybutyric Acid: 6.6 mmol/L — ABNORMAL HIGH (ref 0.05–0.27)
Beta-Hydroxybutyric Acid: 7.25 mmol/L — ABNORMAL HIGH (ref 0.05–0.27)
Beta-Hydroxybutyric Acid: 4.44 mmol/L — ABNORMAL HIGH (ref 0.05–0.27)

## 2021-09-20 LAB — BLOOD GAS, VENOUS
Acid-base deficit: 5.9 mmol/L — ABNORMAL HIGH (ref 0.0–2.0)
Bicarbonate: 20.2 mmol/L (ref 20.0–28.0)
O2 Saturation: 74.6 %
Patient temperature: 37
pCO2, Ven: 41 mmHg — ABNORMAL LOW (ref 44–60)
pH, Ven: 7.3 (ref 7.25–7.43)
pO2, Ven: 44 mmHg (ref 32–45)

## 2021-09-20 LAB — MRSA NEXT GEN BY PCR, NASAL: MRSA by PCR Next Gen: NOT DETECTED

## 2021-09-20 LAB — HIV ANTIBODY (ROUTINE TESTING W REFLEX): HIV Screen 4th Generation wRfx: NONREACTIVE

## 2021-09-20 LAB — SARS CORONAVIRUS 2 BY RT PCR: SARS Coronavirus 2 by RT PCR: NEGATIVE

## 2021-09-20 LAB — LIPASE, BLOOD: Lipase: 28 U/L (ref 11–51)

## 2021-09-20 MED ORDER — ONDANSETRON HCL 4 MG/2ML IJ SOLN
4.0000 mg | Freq: Once | INTRAMUSCULAR | Status: AC
  Administered 2021-09-20: 4 mg via INTRAVENOUS
  Filled 2021-09-20: qty 2

## 2021-09-20 MED ORDER — LEVOTHYROXINE SODIUM 112 MCG PO TABS
112.0000 ug | ORAL_TABLET | ORAL | Status: DC
  Administered 2021-09-22: 112 ug via ORAL
  Filled 2021-09-20 (×2): qty 1

## 2021-09-20 MED ORDER — KETOROLAC TROMETHAMINE 15 MG/ML IJ SOLN
15.0000 mg | Freq: Once | INTRAMUSCULAR | Status: AC
  Administered 2021-09-20: 15 mg via INTRAVENOUS
  Filled 2021-09-20: qty 1

## 2021-09-20 MED ORDER — ENOXAPARIN SODIUM 40 MG/0.4ML IJ SOSY
40.0000 mg | PREFILLED_SYRINGE | INTRAMUSCULAR | Status: DC
  Administered 2021-09-20 – 2021-09-21 (×2): 40 mg via SUBCUTANEOUS
  Filled 2021-09-20 (×2): qty 0.4

## 2021-09-20 MED ORDER — DEXTROSE 50 % IV SOLN: 0.0000 mL | INTRAVENOUS | Status: DC | PRN

## 2021-09-20 MED ORDER — PANTOPRAZOLE SODIUM 40 MG IV SOLR
40.0000 mg | Freq: Once | INTRAVENOUS | Status: AC
  Administered 2021-09-20: 40 mg via INTRAVENOUS
  Filled 2021-09-20: qty 10

## 2021-09-20 MED ORDER — SODIUM CHLORIDE 0.9 % IV BOLUS
1000.0000 mL | Freq: Once | INTRAVENOUS | Status: AC
  Administered 2021-09-20: 1000 mL via INTRAVENOUS

## 2021-09-20 MED ORDER — LEVOTHYROXINE SODIUM 112 MCG PO TABS: 112.0000 ug | ORAL_TABLET | Freq: Every day | ORAL | Status: DC

## 2021-09-20 MED ORDER — LEVETIRACETAM IN NACL 500 MG/100ML IV SOLN
500.0000 mg | Freq: Two times a day (BID) | INTRAVENOUS | Status: DC
  Administered 2021-09-20 – 2021-09-22 (×4): 500 mg via INTRAVENOUS
  Filled 2021-09-20 (×4): qty 100

## 2021-09-20 MED ORDER — LACTATED RINGERS IV SOLN: INTRAVENOUS | Status: DC

## 2021-09-20 MED ORDER — LEVETIRACETAM 500 MG PO TABS
500.0000 mg | ORAL_TABLET | Freq: Two times a day (BID) | ORAL | Status: DC
  Administered 2021-09-20: 500 mg via ORAL
  Filled 2021-09-20 (×2): qty 1

## 2021-09-20 MED ORDER — INSULIN REGULAR(HUMAN) IN NACL 100-0.9 UT/100ML-% IV SOLN
INTRAVENOUS | Status: DC
  Administered 2021-09-20: 8 [IU]/h via INTRAVENOUS
  Filled 2021-09-20: qty 100

## 2021-09-20 MED ORDER — LEVOTHYROXINE SODIUM 112 MCG PO TABS: 168.0000 ug | ORAL_TABLET | ORAL | Status: DC

## 2021-09-20 MED ORDER — DEXTROSE IN LACTATED RINGERS 5 % IV SOLN: INTRAVENOUS | Status: DC

## 2021-09-20 MED ORDER — SODIUM CHLORIDE 0.9 % IV SOLN: Freq: Once | INTRAVENOUS | Status: AC

## 2021-09-20 MED ORDER — CHLORHEXIDINE GLUCONATE CLOTH 2 % EX PADS
6.0000 | MEDICATED_PAD | Freq: Every day | CUTANEOUS | Status: DC
Start: 2021-09-21 — End: 2021-09-22
  Administered 2021-09-21: 6 via TOPICAL

## 2021-09-20 MED ORDER — LACTATED RINGERS IV BOLUS
1000.0000 mL | Freq: Once | INTRAVENOUS | Status: AC
  Administered 2021-09-20: 1000 mL via INTRAVENOUS

## 2021-09-20 NOTE — ED Notes (Signed)
Informed RN bed assigned 

## 2021-09-20 NOTE — Inpatient Diabetes Management (Signed)
Inpatient Diabetes Program Recommendations  AACE/ADA: New Consensus Statement on Inpatient Glycemic Control (2015)  Target Ranges:  Prepandial:   less than 140 mg/dL      Peak postprandial:   less than 180 mg/dL (1-2 hours)      Critically ill patients:  140 - 180 mg/dL   Lab Results  Component Value Date   GLUCAP >600 (HH) 09/20/2021   HGBA1C 8.6 (H) 02/22/2021    Review of Glycemic Control  Latest Reference Range & Units 09/20/21 09:21  Chloride 98 - 111 mmol/L 97 (L)  Glucose 70 - 99 mg/dL 784 (HH)  Anion gap 5 - 15  22 (H)  (HH): Data is critically high (L): Data is abnormally low (H): Data is abnormally high  Diabetes history: DM1(does not make insulin.  Needs correction, basal and meal coverage)  Outpatient Diabetes medications: 70/30 22 units QAM and 30 units QPM & Freestyle Libre CGM  Current orders for Inpatient glycemic control: IV insulin  Inpatient Diabetes Program Recommendations:    When criteria met and MD is ready to transition to SQ insulin, might consider:  1-Semglee 18 units 2 hrs prior to discontinuing IV insulin (50% of home dose). 2-Novolog 0-9 units TID and 0-5 units QHS 3-Novolog 3 units TID with meals if consumes at least 50%   Spoke with Uncle at bedside.  Patient mainly lives with him.  This uncles brother, Shanon Brow has legal guardianship of Francisco Colon.  They have been having their home renovated for the last couple of weeks and [name] has been staying with a cousin.  Normally his uncle whom he lives with administers [name]'s insulin and monitors his blood glucose.  Uncle states he does not think the cousin was giving him his insulin.    Francisco Colon is current with Dr. Gabriel Carina for endocrinology.  Has an appointment tomorrow.  His uncle will reschedule.  Their home should be completed in the next couple of weeks.  His uncle states he will discharge home with him and he will resume management of [name]'s diabetes.    Will continue to follow while inpatient.  Thank  you, Reche Dixon, MSN, [place] Diabetes Coordinator Inpatient Diabetes Program [phone] (team pager from 8a-5p)    Will continue to follow while inpatient.  Thank you, Reche Dixon, MSN, [place] Diabetes Coordinator Inpatient Diabetes Program [phone] (team pager from 8a-5p)

## 2021-09-20 NOTE — H&P (Signed)
History and Physical    Patient: Francisco Colon WIO:[id number] DOB: 02/02/1990 DOA: 09/20/2021 DOS: the patient was seen and examined on 09/20/2021 PCP: Sharyne Peach, MD  Patient coming from: Home  Chief Complaint:  Chief Complaint  Patient presents with   Abdominal Pain   Most of the history was obtained from a family member at the bedside. HPI: Francisco Colon is a 31 y.o. male with medical history significant of insulin-dependent diabetes mellitus type 1, hypothyroidism, seizure disorder, autism who was brought into the ER for evaluation of abdominal pain associated with nausea and vomiting. Abdominal pain is diffuse and associated with nausea and an episode of emesis.  He denies having any diarrhea.  He denies having any fever or chills. According to the family member who provided most of the history, patient has been staying with a cousin because they were getting the apartment renovated.  The cousin had been instructed to administer the patient's insulin and check his blood sugars but according to the family member at the bedside they probably were not doing what they are supposed to do.  Patient is unable to tell me when he last received his insulin. He denies having any chest pain, no shortness of breath, no dizziness, no lightheadedness, no cough, no headache, no blurred vision or focal deficit. Upon arrival to the ER patient was noted to have an anion gap metabolic acidosis and was started on an insulin drip. He will be admitted to the hospital for further evaluation.    Review of Systems: As mentioned in the history of present illness. All other systems reviewed and are negative. Past Medical History:  Diagnosis Date   Autism spectrum disorder    DKA (diabetic ketoacidoses) 09/26/2018   Hypothyroidism    Seizure ([place]) 2012   hypoglycemia induced - s/p eval by neuro   Thyroid nodule    by prior US   Type 1 diabetes ([place]) 2000   Past Surgical History:  Procedure  Laterality Date   NO PAST SURGERIES     XI ROBOTIC LAPAROSCOPIC ASSISTED APPENDECTOMY N/A 02/21/2021   Procedure: XI ROBOTIC LAPAROSCOPIC ASSISTED APPENDECTOMY;  Surgeon: Herbert Pun, MD;  Location: [facility] ORS;  Service: General;  Laterality: N/A;   Social History:  reports that he has never smoked. He has never used smokeless tobacco. He reports that he does not drink alcohol and does not use drugs.  No Known Allergies  Family History  Problem Relation Age of Onset   Hypertension Maternal Grandmother    Diabetes Brother    Cancer Mother        lung, deceased    Prior to Admission medications   Medication Sig Start Date End Date Taking? Authorizing Provider  NOVOLOG MIX 70/30 (70-30) 100 UNIT/ML injection Inject 22-30 Units into the skin See admin instructions. Inject 22u under the skin every morning and inject 30u under the skin every evening before meals   Yes [provider]  Glucagon 1 MG/0.2ML SOAJ Inject 1 mg into the skin as needed (hypoglycemic emergency).  Patient not taking: Reported on 09/20/2021    [provider]  levETIRAcetam (KEPPRA) 500 MG tablet Take 500 mg by mouth 2 (two) times daily.  Patient not taking: Reported on 09/20/2021    [provider]  levothyroxine (SYNTHROID) 112 MCG tablet Take 112-168 mcg by mouth daily before breakfast. Patient not taking: Reported on 09/20/2021 12/25/20   [provider]  ondansetron (ZOFRAN ODT) 4 MG disintegrating tablet Take 1 tablet (  4 mg total) by mouth every 8 (eight) hours as needed. Patient not taking: Reported on 09/20/2021 03/29/20   Lavonia Drafts, MD    Physical Exam: Vitals:   09/20/21 1100 09/20/21 1124 09/20/21 1130 09/20/21 1200  BP: (!) 144/96  (!) 143/85 135/74  Pulse: 74  92 90  Resp:   16 (!) 21  Temp:      TempSrc:      SpO2:  98% 94% 91%  Weight:      Height:       Physical Exam Vitals and nursing note reviewed.  Constitutional:      Appearance: He is  well-developed.  HENT:     Head: Normocephalic and atraumatic.     Mouth/Throat:     Mouth: Mucous membranes are moist.  Cardiovascular:     Rate and Rhythm: Normal rate and regular rhythm.  Pulmonary:     Effort: Pulmonary effort is normal.     Breath sounds: Normal breath sounds.  Abdominal:     General: Abdomen is flat. Bowel sounds are normal.     Palpations: Abdomen is soft.     Tenderness: There is generalized abdominal tenderness.  Skin:    General: Skin is warm and dry.  Neurological:     General: No focal deficit present.     Mental Status: He is alert.  Psychiatric:        Mood and Affect: Mood normal.        Behavior: Behavior normal.     Data Reviewed: Relevant notes from primary care and specialist visits, past discharge summaries as available in EHR, including Care Everywhere. Prior diagnostic testing as pertinent to current admission diagnoses Updated medications and problem lists for reconciliation ED course, including vitals, labs, imaging, treatment and response to treatment Triage notes, nursing and pharmacy notes and ED provider's notes Notable results as noted in HPI Labs reviewed.  Lipase 28, sodium 139, potassium 5.6, chloride 97, bicarb 20, glucose 784, BUN 42, creatinine 1.82 compared to baseline of 1.31, calcium 10.8, total protein 9.6, albumin 5.3, AST 24, ALT 18, alk phos 90, total bili 3.8, white count 7.0, hemoglobin 16.4, hematocrit 50.1, platelet count 258, pH 7.3/41/44/20 0.2 Twelve-lead EKG shows normal sinus rhythm  There are no new results to review at this time.  Assessment and Plan: * DKA, type 1 ([place]) Most likely secondary to medication noncompliance Continue insulin drip initiated in the ER Aggressive IV fluid resuscitation Check and correct electrolytes Consult diabetic educator  Seizure disorder [facility]) Continue Keppra Place patient on seizure precautions  Hypothyroidism (acquired) Continue Synthroid      Advance Care  Planning:   Code Status: Full Code   Consults: Diabetic education  Family Communication: Greater than 50% of time was spent discussing patient's condition and plan of care with his family members at the bedside.  All questions and concerns have been addressed.  Severity of Illness: The appropriate patient status for this patient is INPATIENT. Inpatient status is judged to be reasonable and necessary in order to provide the required intensity of service to ensure the patient's safety. The patient's presenting symptoms, physical exam findings, and initial radiographic and laboratory data in the context of their chronic comorbidities is felt to place them at high risk for further clinical deterioration. Furthermore, it is not anticipated that the patient will be medically stable for discharge from the hospital within 2 midnights of admission.   * I certify that at the point of admission it is my clinical  judgment that the patient will require inpatient hospital care spanning beyond 2 midnights from the point of admission due to high intensity of service, high risk for further deterioration and high frequency of surveillance required.*  Author: Collier Bullock, MD 09/20/2021 1:15 PM  For on call review [email or web address].

## 2021-09-20 NOTE — ED Notes (Signed)
Stafford MD made aware of patients BG reading 784 at this time

## 2021-09-20 NOTE — ED Triage Notes (Signed)
Pt here with abd pain since this morning. Pt states he also has N/V but denies diarrhea. Pt is a diabetic.

## 2021-09-20 NOTE — ED Notes (Signed)
First Nurse Note: Pt to ED via POV for abdominal pain and vomiting. Pt is in NAD. Pt had appendectomy 4 months ago.

## 2021-09-20 NOTE — ED Notes (Signed)
CBG 297 

## 2021-09-20 NOTE — ED Provider Notes (Signed)
[facility] Provider Note    Event Date/Time   First MD Initiated Contact with Patient 09/20/21 [phone]     (approximate)   History   Chief Complaint: Abdominal Pain   HPI  Francisco Colon is a 31 y.o. male with type 1 diabetes, autism spectrum disorder who is brought to the ED due to generalized abdominal pain nausea and vomiting that all started this morning.  Father at bedside provides history due to the patient's autism.  He reports that they have been giving the patient his usual insulin and that everything was normal up through bedtime last night.  He reports his blood sugars have been pretty normal of 3 yesterday as well.  No fever.     Physical Exam   Triage Vital Signs: ED Triage Vitals  Enc Vitals Group     BP 09/20/21 0912 121/83     Pulse Rate 09/20/21 0912 100     Resp 09/20/21 0912 19     Temp 09/20/21 0912 98 F (36.7 C)     Temp Source 09/20/21 0912 Oral     SpO2 09/20/21 0912 98 %     Weight 09/20/21 0917 169 lb 15.6 oz (77.1 kg)     Height 09/20/21 0917 5\' 7"  (1.702 m)     Head Circumference --      Peak Flow --      Pain Score 09/20/21 0916 10     Pain Loc --      Pain Edu? --      Excl. in GC? --     Most recent vital signs: Vitals:   09/20/21 1124 09/20/21 1130  BP:  (!) 143/85  Pulse:  92  Resp:  16  Temp:    SpO2: 98% 94%    General: Awake, no distress.  CV:  Good peripheral perfusion.  Tachycardia heart rate 100 Resp:  Normal effort.  No tachypnea.  Clear to auscultation bilaterally Abd:  No distention.  Soft with diffuse tenderness.  No peritoneal signs. Other:  Dry mucous membranes.  Normal voice.   ED Results / Procedures / Treatments   Labs (all labs ordered are listed, but only abnormal results are displayed) Labs Reviewed  COMPREHENSIVE METABOLIC PANEL - Abnormal; Notable for the following components:      Result Value   Potassium 5.6 (*)    Chloride 97 (*)    CO2 20 (*)    Glucose, Bld 784 (*)     BUN 42 (*)    Creatinine, Ser 1.82 (*)    Calcium 10.8 (*)    Total Protein 9.6 (*)    Albumin 5.3 (*)    Total Bilirubin 3.8 (*)    GFR, Estimated 51 (*)    Anion gap 22 (*)    All other components within normal limits  CBC - Abnormal; Notable for the following components:   RBC 6.43 (*)    MCV 77.9 (*)    MCH 25.5 (*)    All other components within normal limits  BLOOD GAS, VENOUS - Abnormal; Notable for the following components:   pCO2, Ven 41 (*)    Acid-base deficit 5.9 (*)    All other components within normal limits  CBG MONITORING, ED - Abnormal; Notable for the following components:   Glucose-Capillary >600 (*)    All other components within normal limits  SARS CORONAVIRUS 2 BY RT PCR  LIPASE, BLOOD  URINALYSIS, ROUTINE W REFLEX MICROSCOPIC  BETA-HYDROXYBUTYRIC ACID  HIV ANTIBODY (ROUTINE TESTING W REFLEX)  BASIC METABOLIC PANEL  BASIC METABOLIC PANEL  BASIC METABOLIC PANEL  BASIC METABOLIC PANEL  BETA-HYDROXYBUTYRIC ACID  BETA-HYDROXYBUTYRIC ACID  HEMOGLOBIN A1C     EKG    RADIOLOGY    PROCEDURES:  .Critical Care  Performed by: Sharman Cheek, MD Authorized by: Sharman Cheek, MD   Critical care provider statement:    Critical care time (minutes):  35   Critical care time was exclusive of:  Separately billable procedures and treating other patients   Critical care was necessary to treat or prevent imminent or life-threatening deterioration of the following conditions:  Renal failure, dehydration, endocrine crisis and metabolic crisis   Critical care was time spent personally by me on the following activities:  Development of treatment plan with patient or surrogate, discussions with consultants, evaluation of patient's response to treatment, examination of patient, obtaining history from patient or surrogate, ordering and performing treatments and interventions, ordering and review of laboratory studies, ordering and review of radiographic  studies, pulse oximetry, re-evaluation of patient's condition and review of old charts   Care discussed with: admitting provider   Comments:        .1-3 Lead EKG Interpretation  Performed by: Sharman Cheek, MD Authorized by: Sharman Cheek, MD     Interpretation: normal     ECG rate:  90   ECG rate assessment: normal     Rhythm: sinus rhythm     Ectopy: none     Conduction: normal      MEDICATIONS ORDERED IN ED: Medications  insulin regular, human (MYXREDLIN) 100 units/ 100 mL infusion (has no administration in time range)  dextrose 5 % in lactated ringers infusion (has no administration in time range)  dextrose 50 % solution 0-50 mL (has no administration in time range)  0.9 %  sodium chloride infusion (has no administration in time range)  levothyroxine (SYNTHROID) tablet 112-168 mcg (has no administration in time range)  levETIRAcetam (KEPPRA) tablet 500 mg (has no administration in time range)  enoxaparin (LOVENOX) injection 40 mg (has no administration in time range)  lactated ringers infusion (has no administration in time range)  dextrose 50 % solution 0-50 mL (has no administration in time range)  lactated ringers bolus 1,000 mL (0 mLs Intravenous Stopped 09/20/21 1141)  ondansetron (ZOFRAN) injection 4 mg (4 mg Intravenous Given 09/20/21 0956)  ketorolac (TORADOL) 15 MG/ML injection 15 mg (15 mg Intravenous Given 09/20/21 1000)  pantoprazole (PROTONIX) injection 40 mg (40 mg Intravenous Given 09/20/21 0958)  sodium chloride 0.9 % bolus 1,000 mL (1,000 mLs Intravenous New Bag/Given 09/20/21 1144)     IMPRESSION / MDM / ASSESSMENT AND PLAN / ED COURSE  I reviewed the triage vital signs and the nursing notes.                              Differential diagnosis includes, but is not limited to, hyperglycemia, AKI, electrolyte abnormality, UTI, DKA, viral illness  Patient's presentation is most consistent with acute presentation with potential threat to life or  bodily function.  Patient presents with abdominal pain and vomiting, found to have glucose greater than 600.  He appears dehydrated.  Will give IV fluids, Toradol Zofran and Protonix for symptom relief, check labs.       FINAL CLINICAL IMPRESSION(S) / ED DIAGNOSES   Final diagnoses:  Type 1 diabetes mellitus with ketoacidosis without coma (HCC)  Generalized abdominal pain  Autism spectrum disorder     Rx / DC Orders   ED Discharge Orders     None        Note:  This document was prepared using Dragon voice recognition software and may include unintentional dictation errors.   Sharman Cheek, MD 09/20/21 [phone]

## 2021-09-20 NOTE — ED Notes (Signed)
CBG 197. Will start D5 LR.

## 2021-09-20 NOTE — Assessment & Plan Note (Signed)
Most likely secondary to medication noncompliance Continue insulin drip initiated in the ER Aggressive IV fluid resuscitation Check and correct electrolytes Consult diabetic educator

## 2021-09-20 NOTE — Assessment & Plan Note (Signed)
Continue Synthroid °

## 2021-09-20 NOTE — Assessment & Plan Note (Signed)
Continue Keppra Place patient on seizure precautions 

## 2021-09-21 DIAGNOSIS — R001 Bradycardia, unspecified: Secondary | ICD-10-CM

## 2021-09-21 DIAGNOSIS — E1010 Type 1 diabetes mellitus with ketoacidosis without coma: Secondary | ICD-10-CM | POA: Diagnosis not present

## 2021-09-21 LAB — BASIC METABOLIC PANEL
Anion gap: 6 (ref 5–15)
Anion gap: 7 (ref 5–15)
BUN: 25 mg/dL — ABNORMAL HIGH (ref 6–20)
BUN: 26 mg/dL — ABNORMAL HIGH (ref 6–20)
CO2: 25 mmol/L (ref 22–32)
CO2: 27 mmol/L (ref 22–32)
Calcium: 9.1 mg/dL (ref 8.9–10.3)
Calcium: 9.2 mg/dL (ref 8.9–10.3)
Chloride: 110 mmol/L (ref 98–111)
Chloride: 111 mmol/L (ref 98–111)
Creatinine, Ser: 1.19 mg/dL (ref 0.61–1.24)
Creatinine, Ser: 1.32 mg/dL — ABNORMAL HIGH (ref 0.61–1.24)
GFR, Estimated: >60 mL/min (ref >60)
GFR, Estimated: >60 mL/min (ref >60)
Glucose, Bld: 150 mg/dL — ABNORMAL HIGH (ref 70–99)
Glucose, Bld: 166 mg/dL — ABNORMAL HIGH (ref 70–99)
Potassium: 3.6 mmol/L (ref 3.5–5.1)
Potassium: 3.7 mmol/L (ref 3.5–5.1)
Sodium: 142 mmol/L (ref 135–145)
Sodium: 144 mmol/L (ref 135–145)

## 2021-09-21 LAB — GLUCOSE, CAPILLARY
Glucose-Capillary: 138 mg/dL — ABNORMAL HIGH (ref 70–99)
Glucose-Capillary: 147 mg/dL — ABNORMAL HIGH (ref 70–99)
Glucose-Capillary: 151 mg/dL — ABNORMAL HIGH (ref 70–99)
Glucose-Capillary: 155 mg/dL — ABNORMAL HIGH (ref 70–99)
Glucose-Capillary: 156 mg/dL — ABNORMAL HIGH (ref 70–99)
Glucose-Capillary: 162 mg/dL — ABNORMAL HIGH (ref 70–99)
Glucose-Capillary: 166 mg/dL — ABNORMAL HIGH (ref 70–99)
Glucose-Capillary: 166 mg/dL — ABNORMAL HIGH (ref 70–99)
Glucose-Capillary: 197 mg/dL — ABNORMAL HIGH (ref 70–99)
Glucose-Capillary: 232 mg/dL — ABNORMAL HIGH (ref 70–99)
Glucose-Capillary: 242 mg/dL — ABNORMAL HIGH (ref 70–99)
Glucose-Capillary: 305 mg/dL — ABNORMAL HIGH (ref 70–99)
Glucose-Capillary: 350 mg/dL — ABNORMAL HIGH (ref 70–99)

## 2021-09-21 LAB — HEMOGLOBIN A1C
Hgb A1c MFr Bld: 8.4 % — ABNORMAL HIGH (ref 4.8–5.6)
Mean Plasma Glucose: 194.38 mg/dL

## 2021-09-21 LAB — BETA-HYDROXYBUTYRIC ACID: Beta-Hydroxybutyric Acid: 0.7 mmol/L — ABNORMAL HIGH (ref 0.05–0.27)

## 2021-09-21 MED ORDER — INSULIN GLARGINE-YFGN 100 UNIT/ML ~~LOC~~ SOLN
10.0000 [IU] | Freq: Every day | SUBCUTANEOUS | Status: DC
  Administered 2021-09-21: 10 [IU] via SUBCUTANEOUS
  Filled 2021-09-21: qty 0.1

## 2021-09-21 MED ORDER — INSULIN GLARGINE-YFGN 100 UNIT/ML ~~LOC~~ SOLN
18.0000 [IU] | Freq: Every day | SUBCUTANEOUS | Status: DC
  Administered 2021-09-22: 18 [IU] via SUBCUTANEOUS
  Filled 2021-09-21: qty 0.18

## 2021-09-21 MED ORDER — INSULIN ASPART 100 UNIT/ML IJ SOLN: 0.0000 [IU] | Freq: Three times a day (TID) | INTRAMUSCULAR | Status: DC

## 2021-09-21 MED ORDER — INSULIN ASPART 100 UNIT/ML IJ SOLN
0.0000 [IU] | Freq: Three times a day (TID) | INTRAMUSCULAR | Status: DC
  Administered 2021-09-21: 5 [IU] via SUBCUTANEOUS
  Administered 2021-09-21: 11 [IU] via SUBCUTANEOUS
  Filled 2021-09-21 (×2): qty 1

## 2021-09-21 MED ORDER — INSULIN ASPART 100 UNIT/ML IJ SOLN
0.0000 [IU] | Freq: Three times a day (TID) | INTRAMUSCULAR | Status: DC
  Administered 2021-09-21: 11 [IU] via SUBCUTANEOUS
  Administered 2021-09-22: 5 [IU] via SUBCUTANEOUS
  Administered 2021-09-22: 8 [IU] via SUBCUTANEOUS
  Filled 2021-09-21 (×4): qty 1

## 2021-09-21 NOTE — Progress Notes (Signed)
PROGRESS NOTE    Francisco Colon  MVE:[id number] DOB: December 10, 1990 DOA: 09/20/2021 PCP: Rayetta Humphrey, MD   Brief Narrative:  This 31 years old male with PMH significant for insulin-dependent diabetes mellitus type 1, hypothyroidism, seizure disorder, autism who brought in the ED for the evaluation of abdominal pain associated with nausea and vomiting.  As per family patient has been  staying with a cousin because they were getting apartment renovated.  The cousin had been instructed to administer the patient insulin and check his blood sugar but according to the family member at the bedside they were not doing what they were supposed to do. Patient is admitted for DKA and started on insulin drip.  Now anion gap is closed,  DKA has resolved.  Patient transition to subcu insulin.   Assessment & Plan:   Principal Problem:   DKA, type 1 (HCC) Active Problems:   Hypothyroidism (acquired)   Seizure disorder (HCC)  Diabetic ketoacidosis: Most likely secondary to medication noncompliance. Patient initiated on insulin drip in the ED Continue aggressive IV hydration and insulin drip as per DKA protocol. Anion gap is now closed.  Patient is successfully transitioned to subcu insulin. Electrolytes replaced and corrected. Diabetic coordinator consulted.  Patient started on Semglee 18 units daily in addition to sliding scale. Carb modified diet.  Hemoglobin A1c at 11.9 poorly controlled. Patient needs outpatient endocrinology consult.   Seizure disorder: Continue Keppra 500 mg IV twice daily Continue seizure precautions  Hypothyroidism: Continue Synthroid.  Autism:  remains stable.   DVT prophylaxis: Lovenox Code Status: Full code Family Communication: No family at bedside Disposition Plan:  Status is: Inpatient Remains inpatient appropriate because: Admitted for DKA requiring insulin drip now blood sugar has improved.  Anion gap closed.  Transitioned to subcu insulin. Anticipated  discharge home tomorrow.    Consultants:  None  Procedures: None Antimicrobials: None  Subjective: Patient was seen and examined at bedside.  Overnight events noted.   Patient reports feeling better,  denies any nausea and vomiting. He reports abdominal pain has improved.  Objective: Vitals:   09/21/21 1200 09/21/21 1300 09/21/21 1400 09/21/21 1500  BP: 120/71 125/81 117/70 (!) 115/58  Pulse: 62 74 77 70  Resp: 18 (!) 23 (!) 21 16  Temp: 98.2 F (36.8 C)     TempSrc: Oral     SpO2: 96% 100% 95% 97%  Weight:      Height:        Intake/Output Summary (Last 24 hours) at 09/21/2021 1534 Last data filed at 09/21/2021 0801 Gross per 24 hour  Intake 711.02 ml  Output 1100 ml  Net -388.98 ml   Filed Weights   09/20/21 0917  Weight: 77.1 kg    Examination:  General exam: Appears comfortable, not in any acute distress.   Respiratory system: CTA bilaterally, no wheezing, no crackles, normal respiratory effort. Cardiovascular system: S1 & S2 heard, rate and rhythm, no murmur. Gastrointestinal system: Abdomen is soft, non tender, non distended, BS +. Central nervous system: Alert and oriented x 2 . No focal neurological deficits. Extremities: No edema, no cyanosis, no clubbing. Skin: No rashes, lesions or ulcers Psychiatry: Judgement and insight appear normal. Mood & affect appropriate.     Data Reviewed: I have personally reviewed following labs and imaging studies  CBC: Recent Labs  Lab 09/20/21 0921  WBC 7.0  HGB 16.4  HCT 50.1  MCV 77.9*  PLT 258   Basic Metabolic Panel: Recent Labs  Lab 09/20/21 [phone]  09/20/21 1229 09/20/21 1944 09/20/21 2337 09/21/21 0403  NA 139 143 143 142 144  K 5.6* 5.2* 4.1 3.6 3.7  CL 97* 106 107 110 111  CO2 20* 19* 19* 25 27  GLUCOSE 784* 592* 315* 166* 150*  BUN 42* 39* 34* 26* 25*  CREATININE 1.82* 1.70* 1.57* 1.32* 1.19  CALCIUM 10.8* 9.6 9.8 9.2 9.1   GFR: Estimated Creatinine Clearance: 85.6 mL/min (by C-G formula  based on SCr of 1.19 mg/dL). Liver Function Tests: Recent Labs  Lab 09/20/21 0921  AST 24  ALT 18  ALKPHOS 90  BILITOT 3.8*  PROT 9.6*  ALBUMIN 5.3*   Recent Labs  Lab 09/20/21 0921  LIPASE 28   No results for input(s): "AMMONIA" in the last 168 hours. Coagulation Profile: No results for input(s): "INR", "PROTIME" in the last 168 hours. Cardiac Enzymes: No results for input(s): "CKTOTAL", "CKMB", "CKMBINDEX", "TROPONINI" in the last 168 hours. BNP (last 3 results) No results for input(s): "PROBNP" in the last 8760 hours. HbA1C: Recent Labs    09/20/21 1229  HGBA1C 8.4*   CBG: Recent Labs  Lab 09/21/21 0632 09/21/21 0737 09/21/21 0940 09/21/21 1124 09/21/21 1231  GLUCAP 166* 151* 166* 242* 232*   Lipid Profile: No results for input(s): "CHOL", "HDL", "LDLCALC", "TRIG", "CHOLHDL", "LDLDIRECT" in the last 72 hours. Thyroid Function Tests: No results for input(s): "TSH", "T4TOTAL", "FREET4", "T3FREE", "THYROIDAB" in the last 72 hours. Anemia Panel: No results for input(s): "VITAMINB12", "FOLATE", "FERRITIN", "TIBC", "IRON", "RETICCTPCT" in the last 72 hours. Sepsis Labs: No results for input(s): "PROCALCITON", "LATICACIDVEN" in the last 168 hours.  Recent Results (from the past 240 hour(s))  SARS Coronavirus 2 by RT PCR (hospital order, performed in [facility] hospital lab) *cepheid single result test* Anterior Nasal Swab     Status: None   Collection Time: 09/20/21  9:42 AM   Specimen: Anterior Nasal Swab  Result Value Ref Range Status   SARS Coronavirus 2 by RT PCR NEGATIVE NEGATIVE Final    Comment: (NOTE) SARS-CoV-2 target nucleic acids are NOT DETECTED.  The SARS-CoV-2 RNA is generally detectable in upper and lower respiratory specimens during the acute phase of infection. The lowest concentration of SARS-CoV-2 viral copies this assay can detect is 250 copies / mL. A negative result does not preclude SARS-CoV-2 infection and should not be used as the  sole basis for treatment or other patient management decisions.  A negative result may occur with improper specimen collection / handling, submission of specimen other than nasopharyngeal swab, presence of viral mutation(s) within the areas targeted by this assay, and inadequate number of viral copies (<250 copies / mL). A negative result must be combined with clinical observations, patient history, and epidemiological information.  Fact Sheet for Patients:   RoadLapTop.co.za  Fact Sheet for Healthcare Providers: http://kim-miller.com/  This test is not yet approved or  cleared by the [place] FDA and has been authorized for detection and/or diagnosis of SARS-CoV-2 by FDA under an Emergency Use Authorization (EUA).  This EUA will remain in effect (meaning this test can be used) for the duration of the COVID-19 declaration under Section 564(b)(1) of the Act, 21 U.S.C. section 360bbb-3(b)(1), unless the authorization is terminated or revoked sooner.  Performed at [facility], [address] [place], [place] [address]   MRSA Next Gen by PCR, Nasal     Status: None   Collection Time: 09/20/21  6:30 PM   Specimen: Nasal Mucosa; Nasal Swab  Result Value Ref  Range Status   MRSA by PCR Next Gen NOT DETECTED NOT DETECTED Final    Comment: (NOTE) The GeneXpert MRSA Assay (FDA approved for NASAL specimens only), is one component of a comprehensive MRSA colonization surveillance program. It is not intended to diagnose MRSA infection nor to guide or monitor treatment for MRSA infections. Test performance is not FDA approved in patients less than 61 years old. Performed at [facility], [address] [place], [place] [address]     Radiology Studies: No results found.  Scheduled Meds:  Chlorhexidine Gluconate Cloth  6 each Topical Daily   enoxaparin (LOVENOX) injection  40 mg Subcutaneous Q24H   insulin aspart   0-15 Units Subcutaneous TID WC   [START ON 09/22/2021] insulin glargine-yfgn  18 Units Subcutaneous Daily   levothyroxine  112 mcg Oral Once per day on Sun Mon Tue Wed Thu Fri   And   [START ON 09/23/2021] levothyroxine  168 mcg Oral Once per day on Sat   Continuous Infusions:  dextrose 5% lactated ringers 125 mL/hr at 09/21/21 1049   insulin 0.9 Units/hr (09/21/21 0900)   lactated ringers Stopped (09/20/21 1625)   levETIRAcetam 500 mg (09/21/21 1121)     LOS: 1 day    Time spent: 35 mins    Dondrea Clendenin, MD [facility]   If 7PM-7AM, please contact night-coverage

## 2021-09-21 NOTE — Inpatient Diabetes Management (Signed)
Inpatient Diabetes Program Recommendations  AACE/ADA: New Consensus Statement on Inpatient Glycemic Control (2015)  Target Ranges:  Prepandial:   less than 140 mg/dL      Peak postprandial:   less than 180 mg/dL (1-2 hours)      Critically ill patients:  140 - 180 mg/dL   Lab Results  Component Value Date   GLUCAP 151 (H) 09/21/2021   HGBA1C 8.6 (H) 02/22/2021    Review of Glycemic Control  Latest Reference Range & Units 09/21/21 03:26 09/21/21 04:25 09/21/21 05:28 09/21/21 06:32 09/21/21 07:37  Glucose-Capillary 70 - 99 mg/dL 301 (H)  1.1 units 601 (H)  0.9 units 162 (H)  1.1 units 166 (H)  1.1 units 151 (H)    (H): Data is abnormally high   Diabetes history: DM1(does not make insulin.  Needs correction, basal and meal coverage)   Outpatient Diabetes medications: 70/30 22 units QAM and 30 units QPM & Freestyle Libre CGM  Inpatient Diabetes Program Recommendations:    1-Semglee 18 units 2 hrs prior to discontinuing IV insulin (50% of home dose). 2-Novolog 0-9 units TID and 0-5 units QHS 3-Novolog 3 units TID with meals if consumes at least 50%  Will continue to follow while inpatient.  Thank you, Dulce Sellar, MSN, CDCES Diabetes Coordinator Inpatient Diabetes Program [phone] (team pager from 8a-5p)

## 2021-09-21 NOTE — TOC Initial Note (Signed)
Transition of Care [facility]) - Initial/Assessment Note    Patient Details  Name: Francisco Colon MRN: [id number] Date of Birth: October 12, 1990  Transition of Care [facility]) CM/SW Contact:    Allayne Butcher, RN Phone Number: 09/21/2021, 11:15 AM  Clinical Narrative:                 Patient has a legal guardian, [name], listed as patient's uncle.  RNCM attempted to call legal guardian x2 on mobile phone, with no answer and no VM set up.  Tried calling home number which is disconnected.  TOC identifies no needs at this time.   Expected Discharge Plan: Home/Self Care Barriers to Discharge: Continued Medical Work up   Patient Goals and CMS Choice        Expected Discharge Plan and Services Expected Discharge Plan: Home/Self Care                                              Prior Living Arrangements/Services                       Activities of Daily Living      Permission Sought/Granted                  Emotional Assessment              Admission diagnosis:  Generalized abdominal pain [R10.84] Autism spectrum disorder [F84.0] DKA, type 2 (HCC) [E11.10] Type 1 diabetes mellitus with ketoacidosis without coma (HCC) [E10.10] Patient Active Problem List   Diagnosis Date Noted   DKA, type 2 (HCC) 09/20/2021   Appendicolith 02/22/2021   Acute appendicitis 02/21/2021   Acute appendicitis with localized peritonitis 02/21/2021   Type 1 diabetes (HCC)    Hypothyroidism (acquired)    Seizure disorder (HCC)    Autism spectrum disorder    DKA, type 1 (HCC) 07/27/2011   Active autistic disorder 04/27/2011   Acquired hypothyroidism 04/27/2011   Seizure (HCC) 04/27/2011   Sickle cell trait (HCC) 04/27/2011   PCP:  Rayetta Humphrey, MD Pharmacy:   [facility] [address], [place] - [address] [place] [address] Phone: [phone] Fax: [phone]  [facility] [phone] [place], [place] - [address] [place] [address] Phone: [phone] Fax: [phone]  [id number] [place], [place] - [address] AT [facility] [address] [place] [address] Phone: [phone] Fax: [phone]     Social Determinants of Health (SDOH) Interventions    Readmission Risk Interventions     No data to display

## 2021-09-22 DIAGNOSIS — E1010 Type 1 diabetes mellitus with ketoacidosis without coma: Secondary | ICD-10-CM | POA: Diagnosis not present

## 2021-09-22 LAB — BASIC METABOLIC PANEL
Anion gap: 5 (ref 5–15)
BUN: 18 mg/dL (ref 6–20)
CO2: 27 mmol/L (ref 22–32)
Calcium: 8.6 mg/dL — ABNORMAL LOW (ref 8.9–10.3)
Chloride: 111 mmol/L (ref 98–111)
Creatinine, Ser: 1.09 mg/dL (ref 0.61–1.24)
GFR, Estimated: >60 mL/min (ref >60)
Glucose, Bld: 184 mg/dL — ABNORMAL HIGH (ref 70–99)
Potassium: 3.6 mmol/L (ref 3.5–5.1)
Sodium: 143 mmol/L (ref 135–145)

## 2021-09-22 LAB — PHOSPHORUS: Phosphorus: 3.5 mg/dL (ref 2.5–4.6)

## 2021-09-22 LAB — GLUCOSE, CAPILLARY
Glucose-Capillary: 93 mg/dL (ref 70–99)
Glucose-Capillary: 215 mg/dL — ABNORMAL HIGH (ref 70–99)
Glucose-Capillary: 276 mg/dL — ABNORMAL HIGH (ref 70–99)

## 2021-09-22 LAB — MAGNESIUM: Magnesium: 1.5 mg/dL — ABNORMAL LOW (ref 1.7–2.4)

## 2021-09-22 MED ORDER — MAGNESIUM SULFATE 2 GM/50ML IV SOLN
2.0000 g | Freq: Once | INTRAVENOUS | Status: AC
  Administered 2021-09-22: 2 g via INTRAVENOUS
  Filled 2021-09-22: qty 50

## 2021-09-22 NOTE — Discharge Summary (Signed)
Physician Discharge Summary  Francisco Colon:[id number] DOB: 29-Sep-1990 DOA: 09/20/2021  PCP: Francisco Humphrey, MD  Admit date: 09/20/2021  Discharge date: 09/22/2021  Admitted From: Home.  Disposition:  Home.  Recommendations for Outpatient Follow-up:  Follow up with PCP in 1-2 weeks. Please obtain BMP/CBC in one week. Advised to follow-up with Endocrinology as scheduled. Advised to continue insulin as suggested. Compliance emphasized in detail.  Home Health:None Equipment/Devices:None  Discharge Condition: Stable CODE STATUS:Full code Diet recommendation: Carb Modified   Brief Summary/ Hospital Course: This 31 years old male with PMH significant for insulin-dependent diabetes mellitus type 1, hypothyroidism, seizure disorder, autism who was brought in the ED for the evaluation of abdominal pain associated with nausea and vomiting.  As per family patient has been  staying with a cousin because they were getting apartment renovated.  The cousin had been instructed to administer the patient insulin and check his blood sugar but according to the family member at the bedside they were not doing what they were supposed to do. Patient was admitted for DKA and started on insulin drip.  He was managed as per DKA protocol in the ICU.  Now anion gap is closed,  DKA has resolved.  Patient transitioned successfully to subcu insulin.  Patient resumed on carb modified diet.  Tolerated well.  Patient's family was instructed about insulin regimen.  Patient is being discharged home.  Discharge Diagnoses:  Principal Problem:   DKA, type 1 (HCC) Active Problems:   Hypothyroidism (acquired)   Seizure disorder (HCC)    Diabetic ketoacidosis: Most likely secondary to medication noncompliance. Patient initiated on insulin drip in the ED Continue aggressive IV hydration and insulin drip as per DKA protocol. Anion gap is now closed.  Patient is successfully transitioned to subcu  insulin. Electrolytes replaced and corrected. Diabetic coordinator consulted.  Patient started on Semglee 18 units daily in addition to sliding scale. Carb modified diet.  Hemoglobin A1c at 11.9 poorly controlled. Patient needs outpatient endocrinology consult.     Seizure disorder: Continue Keppra 500 mg IV twice daily Continue seizure precautions   Hypothyroidism: Continue Synthroid.   Autism:  remains stable.  Discharge Instructions  Discharge Instructions     Call MD for:  difficulty breathing, headache or visual disturbances   Complete by: As directed    Call MD for:  persistant dizziness or light-headedness   Complete by: As directed    Call MD for:  persistant nausea and vomiting   Complete by: As directed    Diet - low sodium heart healthy   Complete by: As directed    Diet Carb Modified   Complete by: As directed    Discharge instructions   Complete by: As directed    Advised to follow-up with primary care physician in 1 week. Advised to follow-up with endocrinology as scheduled. Advised to continue insulin as suggested. Compliance emphasized in detail.   Increase activity slowly   Complete by: As directed       Allergies as of 09/22/2021   No Known Allergies      Medication List     STOP taking these medications    Glucagon 1 MG/0.2ML Soaj   ondansetron 4 MG disintegrating tablet Commonly known as: Zofran ODT       TAKE these medications    levETIRAcetam 500 MG tablet Commonly known as: KEPPRA Take 500 mg by mouth 2 (two) times daily.   levothyroxine 112 MCG tablet Commonly known as: SYNTHROID Take 112-168 mcg  by mouth daily before breakfast.   NovoLOG Mix 70/30 (70-30) 100 UNIT/ML injection Generic drug: insulin aspart protamine- aspart Inject 22-30 Units into the skin See admin instructions. Inject 22u under the skin every morning and inject 30u under the skin every evening before meals        Follow-up Information     Francisco Humphrey, MD Follow up in 1 week(s).   Specialty: Family Medicine Contact information: [address] [place] [address] [phone]                No Known Allergies  Consultations: None   Procedures/Studies: No results found.    Subjective: Patient was seen and examined at bedside.  Overnight events noted. Patient feels much improved.  Blood sugar has controlled.  Patient is being discharged home.  Discharge Exam: Vitals:   09/22/21 0800 09/22/21 1000  BP: 120/82 134/81  Pulse: 73 (!) 102  Resp: 17 (!) 27  Temp: 98.3 F (36.8 C)   SpO2: 97% 99%   Vitals:   09/22/21 0700 09/22/21 0730 09/22/21 0800 09/22/21 1000  BP: 119/85  120/82 134/81  Pulse: 69 68 73 (!) 102  Resp: 16 18 17  (!) 27  Temp:   98.3 F (36.8 C)   TempSrc:   Oral   SpO2: 97% 97% 97% 99%  Weight:      Height:        General: Pt is alert, awake, not in acute distress Cardiovascular: RRR, S1/S2 +, no rubs, no gallops Respiratory: CTA bilaterally, no wheezing, no rhonchi Abdominal: Soft, NT, ND, bowel sounds + Extremities: no edema, no cyanosis    The results of significant diagnostics from this hospitalization (including imaging, microbiology, ancillary and laboratory) are listed below for reference.     Microbiology: Recent Results (from the past 240 hour(s))  SARS Coronavirus 2 by RT PCR (hospital order, performed in [facility] hospital lab) *cepheid single result test* Anterior Nasal Swab     Status: None   Collection Time: 09/20/21  9:42 AM   Specimen: Anterior Nasal Swab  Result Value Ref Range Status   SARS Coronavirus 2 by RT PCR NEGATIVE NEGATIVE Final    Comment: (NOTE) SARS-CoV-2 target nucleic acids are NOT DETECTED.  The SARS-CoV-2 RNA is generally detectable in upper and lower respiratory specimens during the acute phase of infection. The lowest concentration of SARS-CoV-2 viral copies this assay can detect is 250 copies / mL. A negative result does not  preclude SARS-CoV-2 infection and should not be used as the sole basis for treatment or other patient management decisions.  A negative result may occur with improper specimen collection / handling, submission of specimen other than nasopharyngeal swab, presence of viral mutation(s) within the areas targeted by this assay, and inadequate number of viral copies (<250 copies / mL). A negative result must be combined with clinical observations, patient history, and epidemiological information.  Fact Sheet for Patients:   09/22/21  Fact Sheet for Healthcare Providers: RoadLapTop.co.za  This test is not yet approved or  cleared by the http://kim-miller.com/ FDA and has been authorized for detection and/or diagnosis of SARS-CoV-2 by FDA under an Emergency Use Authorization (EUA).  This EUA will remain in effect (meaning this test can be used) for the duration of the COVID-19 declaration under Section 564(b)(1) of the Act, 21 U.S.C. section 360bbb-3(b)(1), unless the authorization is terminated or revoked sooner.  Performed at [facility], [address] [place], [place]  MRSA Next Gen by PCR, Nasal     Status: None   Collection Time: 09/20/21  6:30 PM   Specimen: Nasal Mucosa; Nasal Swab  Result Value Ref Range Status   MRSA by PCR Next Gen NOT DETECTED NOT DETECTED Final    Comment: (NOTE) The GeneXpert MRSA Assay (FDA approved for NASAL specimens only), is one component of a comprehensive MRSA colonization surveillance program. It is not intended to diagnose MRSA infection nor to guide or monitor treatment for MRSA infections. Test performance is not FDA approved in patients less than 22 years old. Performed at [facility], [address] [place], [place] [address]      Labs: BNP (last 3 results) No results for input(s): "BNP" in the last 8760 hours. Basic Metabolic Panel: Recent Labs  Lab  09/20/21 1229 09/20/21 1944 09/20/21 2337 09/21/21 0403 09/22/21 0526  NA 143 143 142 144 143  K 5.2* 4.1 3.6 3.7 3.6  CL 106 107 110 111 111  CO2 19* 19* 25 27 27   GLUCOSE 592* 315* 166* 150* 184*  BUN 39* 34* 26* 25* 18  CREATININE 1.70* 1.57* 1.32* 1.19 1.09  CALCIUM 9.6 9.8 9.2 9.1 8.6*  MG  --   --   --   --  1.5*  PHOS  --   --   --   --  3.5   Liver Function Tests: Recent Labs  Lab 09/20/21 0921  AST 24  ALT 18  ALKPHOS 90  BILITOT 3.8*  PROT 9.6*  ALBUMIN 5.3*   Recent Labs  Lab 09/20/21 0921  LIPASE 28   No results for input(s): "AMMONIA" in the last 168 hours. CBC: Recent Labs  Lab 09/20/21 0921  WBC 7.0  HGB 16.4  HCT 50.1  MCV 77.9*  PLT 258   Cardiac Enzymes: No results for input(s): "CKTOTAL", "CKMB", "CKMBINDEX", "TROPONINI" in the last 168 hours. BNP: Invalid input(s): "POCBNP" CBG: Recent Labs  Lab 09/21/21 1231 09/21/21 1722 09/21/21 2038 09/22/21 0150 09/22/21 0719  GLUCAP 232* 350* 305* 93 215*   D-Dimer No results for input(s): "DDIMER" in the last 72 hours. Hgb A1c Recent Labs    09/20/21 1229  HGBA1C 8.4*   Lipid Profile No results for input(s): "CHOL", "HDL", "LDLCALC", "TRIG", "CHOLHDL", "LDLDIRECT" in the last 72 hours. Thyroid function studies No results for input(s): "TSH", "T4TOTAL", "T3FREE", "THYROIDAB" in the last 72 hours.  Invalid input(s): "FREET3" Anemia work up No results for input(s): "VITAMINB12", "FOLATE", "FERRITIN", "TIBC", "IRON", "RETICCTPCT" in the last 72 hours. Urinalysis    Component Value Date/Time   COLORURINE STRAW (A) 09/20/2021 1300   APPEARANCEUR CLEAR (A) 09/20/2021 1300   APPEARANCEUR Clear 05/30/2011 1051   LABSPEC 1.024 09/20/2021 1300   LABSPEC 1.003 05/30/2011 1051   PHURINE 5.0 09/20/2021 1300   GLUCOSEU >=500 (A) 09/20/2021 1300   GLUCOSEU 50 mg/dL 09/22/2021 40/08/6759   HGBUR NEGATIVE 09/20/2021 1300   BILIRUBINUR NEGATIVE 09/20/2021 1300   BILIRUBINUR Negative 05/30/2011  1051   KETONESUR 80 (A) 09/20/2021 1300   PROTEINUR NEGATIVE 09/20/2021 1300   NITRITE NEGATIVE 09/20/2021 1300   LEUKOCYTESUR NEGATIVE 09/20/2021 1300   LEUKOCYTESUR Negative 05/30/2011 1051   Sepsis Labs Recent Labs  Lab 09/20/21 0921  WBC 7.0   Microbiology Recent Results (from the past 240 hour(s))  SARS Coronavirus 2 by RT PCR (hospital order, performed in [facility] hospital lab) *cepheid single result test* Anterior Nasal Swab     Status: None   Collection Time: 09/20/21  9:42 AM   Specimen: Anterior Nasal Swab  Result Value Ref Range Status   SARS Coronavirus 2 by RT PCR NEGATIVE NEGATIVE Final    Comment: (NOTE) SARS-CoV-2 target nucleic acids are NOT DETECTED.  The SARS-CoV-2 RNA is generally detectable in upper and lower respiratory specimens during the acute phase of infection. The lowest concentration of SARS-CoV-2 viral copies this assay can detect is 250 copies / mL. A negative result does not preclude SARS-CoV-2 infection and should not be used as the sole basis for treatment or other patient management decisions.  A negative result may occur with improper specimen collection / handling, submission of specimen other than nasopharyngeal swab, presence of viral mutation(s) within the areas targeted by this assay, and inadequate number of viral copies (<250 copies / mL). A negative result must be combined with clinical observations, patient history, and epidemiological information.  Fact Sheet for Patients:   RoadLapTop.co.za  Fact Sheet for Healthcare Providers: http://kim-miller.com/  This test is not yet approved or  cleared by the [place] FDA and has been authorized for detection and/or diagnosis of SARS-CoV-2 by FDA under an Emergency Use Authorization (EUA).  This EUA will remain in effect (meaning this test can be used) for the duration of the COVID-19 declaration under Section 564(b)(1) of the Act, 21  U.S.C. section 360bbb-3(b)(1), unless the authorization is terminated or revoked sooner.  Performed at [facility], [address] [place], [place] [address]   MRSA Next Gen by PCR, Nasal     Status: None   Collection Time: 09/20/21  6:30 PM   Specimen: Nasal Mucosa; Nasal Swab  Result Value Ref Range Status   MRSA by PCR Next Gen NOT DETECTED NOT DETECTED Final    Comment: (NOTE) The GeneXpert MRSA Assay (FDA approved for NASAL specimens only), is one component of a comprehensive MRSA colonization surveillance program. It is not intended to diagnose MRSA infection nor to guide or monitor treatment for MRSA infections. Test performance is not FDA approved in patients less than 65 years old. Performed at [facility], [address], [place], [place] [address]      Time coordinating discharge: Over 30 minutes  SIGNED:   Cipriano Bunker, MD  [facility] 09/22/2021, 11:18 AM Pager   If 7PM-7AM, please contact night-coverage

## 2021-09-22 NOTE — Progress Notes (Signed)
Discharge instructions reviewed with patient and legal guardian, [name], including followup visits and medications.  Understanding was verbalized and all questions were answered.  IVs removed without complication; patient tolerated well.  Patient discharged home via wheelchair in stable condition escorted by nursing staff.

## 2021-09-22 NOTE — Discharge Instructions (Signed)
Advised to follow-up with primary care physician in 1 week. Advised to follow-up with endocrinology as scheduled. Advised to continue insulin as suggested. Compliance emphasized in detail.

## 2021-10-11 DIAGNOSIS — Z978 Presence of other specified devices: Secondary | ICD-10-CM | POA: Insufficient documentation

## 2021-11-28 ENCOUNTER — Ambulatory Visit: Payer: Medicare Other | Admitting: Podiatry

## 2021-12-05 ENCOUNTER — Ambulatory Visit (INDEPENDENT_AMBULATORY_CARE_PROVIDER_SITE_OTHER): Payer: Self-pay | Admitting: Podiatry

## 2021-12-05 DIAGNOSIS — Z91199 Patient's noncompliance with other medical treatment and regimen due to unspecified reason: Secondary | ICD-10-CM

## 2021-12-05 NOTE — Progress Notes (Signed)
   Complete physical exam  Patient: Francisco Colon   DOB: 11/11/1996   31 y.o. Male  MRN: [id number]  Subjective:    No chief complaint on file.   Francisco Colon is a 31 y.o. male who presents today for a complete physical exam. She reports consuming a {diet types:17450} diet. {types:19826} She generally feels {DESC; WELL/FAIRLY WELL/POORLY:18703}. She reports sleeping {DESC; WELL/FAIRLY WELL/POORLY:18703}. She {does/does not:200015} have additional problems to discuss today.    Most recent fall risk assessment:    07/19/2021   10:42 AM  Fall Risk   Falls in the past year? 0  Number falls in past yr: 0  Injury with Fall? 0  Risk for fall due to : No Fall Risks  Follow up Falls evaluation completed     Most recent depression screenings:    07/19/2021   10:42 AM 06/09/2020   10:46 AM  PHQ 2/9 Scores  PHQ - 2 Score 0 0  PHQ- 9 Score 5     {VISON DENTAL STD PSA (Optional):27386}  {History (Optional):23778}  Patient Care Team: Jessup, Joy, NP as PCP - General (Nurse Practitioner)   Outpatient Medications Prior to Visit  Medication Sig   fluticasone (FLONASE) 50 MCG/ACT nasal spray Place 2 sprays into both nostrils in the morning and at bedtime. After 7 days, reduce to once daily.   norgestimate-ethinyl estradiol (SPRINTEC 28) 0.25-35 MG-MCG tablet Take 1 tablet by mouth daily.   Nystatin POWD Apply liberally to affected area 2 times per day   spironolactone (ALDACTONE) 100 MG tablet Take 1 tablet (100 mg total) by mouth daily.   No facility-administered medications prior to visit.    ROS        Objective:     There were no vitals taken for this visit. {Vitals History (Optional):23777}  Physical Exam   No results found for any visits on 08/24/21. {Show previous labs (optional):23779}    Assessment & Plan:    Routine Health Maintenance and Physical Exam  Immunization History  Administered Date(s) Administered   DTaP 01/25/1999, 03/23/1999,  06/01/1999, 02/15/2000, 08/31/2003   Hepatitis A 06/27/2007, 07/02/2008   Hepatitis B 11/12/1998, 12/20/1998, 06/01/1999   HiB (PRP-OMP) 01/25/1999, 03/23/1999, 06/01/1999, 02/15/2000   IPV 01/25/1999, 03/23/1999, 11/20/1999, 08/31/2003   Influenza,inj,Quad PF,6+ Mos 10/02/2013   Influenza-Unspecified 01/02/2012   MMR 11/19/2000, 08/31/2003   Meningococcal Polysaccharide 07/02/2011   Pneumococcal Conjugate-13 02/15/2000   Pneumococcal-Unspecified 06/01/1999, 08/15/1999   Tdap 07/02/2011   Varicella 11/20/1999, 06/27/2007    Health Maintenance  Topic Date Due   HIV Screening  Never done   Hepatitis C Screening  Never done   INFLUENZA VACCINE  08/22/2021   PAP-Cervical Cytology Screening  08/24/2021 (Originally 11/11/2019)   PAP SMEAR-Modifier  08/24/2021 (Originally 11/11/2019)   TETANUS/TDAP  08/24/2021 (Originally 07/01/2021)   HPV VACCINES  Discontinued   COVID-19 Vaccine  Discontinued    Discussed health benefits of physical activity, and encouraged her to engage in regular exercise appropriate for her age and condition.  Problem List Items Addressed This Visit   None Visit Diagnoses     Annual physical exam    -  Primary   Cervical cancer screening       Need for Tdap vaccination          No follow-ups on file.     Joy Jessup, NP   

## 2022-05-01 ENCOUNTER — Ambulatory Visit (INDEPENDENT_AMBULATORY_CARE_PROVIDER_SITE_OTHER): Payer: Medicare Other | Admitting: Podiatry

## 2022-05-01 ENCOUNTER — Encounter: Payer: Self-pay | Admitting: Podiatry

## 2022-05-01 VITALS — BP 131/89 | HR 64

## 2022-05-01 DIAGNOSIS — M79674 Pain in right toe(s): Secondary | ICD-10-CM

## 2022-05-01 DIAGNOSIS — M79675 Pain in left toe(s): Secondary | ICD-10-CM

## 2022-05-01 DIAGNOSIS — B351 Tinea unguium: Secondary | ICD-10-CM | POA: Diagnosis not present

## 2022-05-01 DIAGNOSIS — E119 Type 2 diabetes mellitus without complications: Secondary | ICD-10-CM

## 2022-05-01 NOTE — Progress Notes (Signed)
   SUBJECTIVE Patient presents to office today complaining of elongated, thickened nails that cause pain while ambulating in shoes.  Patient is unable to trim their own nails. Patient is here for further evaluation and treatment.  Past Medical History:  Diagnosis Date   Autism spectrum disorder    DKA (diabetic ketoacidoses) 09/26/2018   Hypothyroidism    Seizure 2012   hypoglycemia induced - s/p eval by neuro   Thyroid nodule    by prior US   Type 1 diabetes 2000    OBJECTIVE General Patient is awake, alert, and oriented x 3 and in no acute distress. Derm Skin is dry and supple bilateral. Negative open lesions or macerations. Remaining integument unremarkable. Nails are tender, long, thickened and dystrophic with subungual debris, consistent with onychomycosis, 1-5 bilateral. No signs of infection noted. Vasc  DP and PT pedal pulses palpable bilaterally. Temperature gradient within normal limits.  Neuro Epicritic and protective threshold sensation grossly intact bilaterally.  Musculoskeletal Exam No symptomatic pedal deformities noted bilateral. Muscular strength within normal limits.  ASSESSMENT 1.  Pain due to onychomycosis of toenails both  PLAN OF CARE 1. Patient evaluated today.  Comprehensive diabetic foot exam performed today 2. Instructed to maintain good pedal hygiene and foot care.  3. Mechanical debridement of nails 1-5 bilaterally performed using a nail nipper. Filed with dremel without incident.  4. Return to clinic in 3 mos.    Felecia Shelling, DPM [facility]  Dr. Felecia Shelling, DPM    [address] [place], [place] [address]                Office [phone]  Fax [phone]

## 2022-07-31 ENCOUNTER — Ambulatory Visit (INDEPENDENT_AMBULATORY_CARE_PROVIDER_SITE_OTHER): Payer: Medicare Other | Admitting: Podiatry

## 2022-07-31 DIAGNOSIS — M79674 Pain in right toe(s): Secondary | ICD-10-CM | POA: Diagnosis not present

## 2022-07-31 DIAGNOSIS — M79675 Pain in left toe(s): Secondary | ICD-10-CM

## 2022-07-31 DIAGNOSIS — B351 Tinea unguium: Secondary | ICD-10-CM

## 2022-07-31 NOTE — Progress Notes (Signed)
? ?  SUBJECTIVE ?Patient presents to office today complaining of elongated, thickened nails that cause pain while ambulating in shoes.  Patient is unable to trim their own nails. Patient is here for further evaluation and treatment. ? ?Past Medical History:  ?Diagnosis Date  ? Autism spectrum disorder   ? DKA (diabetic ketoacidoses) 09/26/2018  ? Hypothyroidism   ? Seizure (HCC) 2012  ? hypoglycemia induced - s/p eval by neuro  ? Thyroid nodule   ? by prior US  ? Type 1 diabetes (HCC) 2000  ? ? ?OBJECTIVE ?General Patient is awake, alert, and oriented x 3 and in no acute distress. ?Derm Skin is dry and supple bilateral. Negative open lesions or macerations. Remaining integument unremarkable. Nails are tender, long, thickened and dystrophic with subungual debris, consistent with onychomycosis, 1-5 bilateral. No signs of infection noted. ?Vasc  DP and PT pedal pulses palpable bilaterally. Temperature gradient within normal limits.  ?Neuro Epicritic and protective threshold sensation grossly intact bilaterally.  ?Musculoskeletal Exam No symptomatic pedal deformities noted bilateral. Muscular strength within normal limits. ? ?ASSESSMENT ?1.  Pain due to onychomycosis of toenails both ? ?PLAN OF CARE ?1. Patient evaluated today.  ?2. Instructed to maintain good pedal hygiene and foot care.  ?3. Mechanical debridement of nails 1-5 bilaterally performed using a nail nipper. Filed with dremel without incident.  ?4. Return to clinic in 3 mos.  ? ? ?Avenell Sellers M. Lashika Erker, DPM ?[facility] ? ?Dr. Dlisa Barnwell M. Raykwon Hobbs, DPM  ?  ?[address]                                     ?[place], [place] [address]                ?Office [phone]  ?Fax [phone] ? ? ? ? ?

## 2022-10-30 ENCOUNTER — Ambulatory Visit (INDEPENDENT_AMBULATORY_CARE_PROVIDER_SITE_OTHER): Payer: Medicare Other | Admitting: Podiatry

## 2022-10-30 ENCOUNTER — Encounter: Payer: Self-pay | Admitting: Podiatry

## 2022-10-30 VITALS — BP 145/86 | HR 59

## 2022-10-30 DIAGNOSIS — M79675 Pain in left toe(s): Secondary | ICD-10-CM

## 2022-10-30 DIAGNOSIS — M79674 Pain in right toe(s): Secondary | ICD-10-CM

## 2022-10-30 DIAGNOSIS — B351 Tinea unguium: Secondary | ICD-10-CM

## 2022-10-30 NOTE — Progress Notes (Signed)
? ?  SUBJECTIVE Patient presents to office today complaining of elongated, thickened nails that cause pain while ambulating in shoes.  Patient is unable to trim their own nails. Patient is here for further evaluation and treatment.  Past Medical History:  Diagnosis Date   Autism spectrum disorder    DKA (diabetic ketoacidoses) 09/26/2018   Hypothyroidism    Seizure (HCC) 2012   hypoglycemia induced - s/p eval by neuro   Thyroid nodule    by prior US   Type 1 diabetes (HCC) 2000    OBJECTIVE General Patient is awake, alert, and oriented x 3 and in no acute distress. Derm Skin is dry and supple bilateral. Negative open lesions or macerations. Remaining integument unremarkable. Nails are tender, long, thickened and dystrophic with subungual debris, consistent with onychomycosis, 1-5 bilateral. No signs of infection noted. Vasc  DP and PT pedal pulses palpable bilaterally. Temperature gradient within normal limits.  Neuro Epicritic and protective threshold sensation grossly intact bilaterally.  Musculoskeletal Exam No symptomatic pedal deformities noted bilateral. Muscular strength within normal limits.  ASSESSMENT 1.  Pain due to onychomycosis of toenails both  PLAN OF CARE 1. Patient evaluated today.  2. Instructed to maintain good pedal hygiene and foot care.  3. Mechanical debridement of nails 1-5 bilaterally performed using a nail nipper. Filed with dremel without incident.  4. Return to clinic in 3 mos.    Marthe Dant M. Kaarin Pardy, DPM [facility]  Dr. Jailin Moomaw M. Palmina Clodfelter, DPM    [address]                                     [place], [place] [address]                Office [phone]  Fax [phone]    

## 2023-02-25 ENCOUNTER — Ambulatory Visit (INDEPENDENT_AMBULATORY_CARE_PROVIDER_SITE_OTHER): Payer: Medicare Other | Admitting: Podiatry

## 2023-02-25 ENCOUNTER — Encounter: Payer: Self-pay | Admitting: Podiatry

## 2023-02-25 DIAGNOSIS — M79674 Pain in right toe(s): Secondary | ICD-10-CM

## 2023-02-25 DIAGNOSIS — M79675 Pain in left toe(s): Secondary | ICD-10-CM | POA: Diagnosis not present

## 2023-02-25 DIAGNOSIS — B351 Tinea unguium: Secondary | ICD-10-CM

## 2023-05-27 ENCOUNTER — Encounter: Payer: Self-pay | Admitting: Podiatry

## 2023-05-27 ENCOUNTER — Ambulatory Visit (INDEPENDENT_AMBULATORY_CARE_PROVIDER_SITE_OTHER): Payer: Medicare Other | Admitting: Podiatry

## 2023-05-27 DIAGNOSIS — E119 Type 2 diabetes mellitus without complications: Secondary | ICD-10-CM

## 2023-05-27 DIAGNOSIS — B351 Tinea unguium: Secondary | ICD-10-CM | POA: Diagnosis not present

## 2023-05-27 DIAGNOSIS — M79674 Pain in right toe(s): Secondary | ICD-10-CM

## 2023-05-27 DIAGNOSIS — L600 Ingrowing nail: Secondary | ICD-10-CM | POA: Diagnosis not present

## 2023-05-27 DIAGNOSIS — M79675 Pain in left toe(s): Secondary | ICD-10-CM | POA: Diagnosis not present

## 2023-05-27 DIAGNOSIS — E109 Type 1 diabetes mellitus without complications: Secondary | ICD-10-CM

## 2023-05-27 NOTE — Patient Instructions (Signed)
EPSOM SALT FOOT SOAK INSTRUCTIONS  Shopping List:  A. Plain epsom salt (not scented) B. Neosporin Cream/Ointment or Bacitracin Cream/Ointment (or prescribed antiobiotic drops/cream/ointment) C. 1-inch fabric band-aids   Place 1/4 cup of epsom salts in 2 quarts of warm tap water. IF YOU ARE DIABETIC, OR HAVE NEUROPATHY, CHECK THE TEMPERATURE OF THE WATER WITH YOUR ELBOW.   Submerge your foot/feet in the solution and soak for 10-15 minutes.      3.  Next, remove your foot/feet from solution, blot dry the affected area.    4.  Apply light amount of antibiotic cream/ointment and cover with fabric band-aid .  5.  This soak should be done once a day for 7 days.   6.  Monitor for any signs/symptoms of infection such as redness, swelling, odor, drainage, increased pain, or non-healing of digit.   7.  Please do not hesitate to call the office and speak to a Nurse or Doctor if you have questions.   8.  If you experience fever, chills, nightsweats, nausea or vomiting with worsening of digit/foot, please go to the emergency room.

## 2023-05-28 ENCOUNTER — Encounter: Payer: Self-pay | Admitting: Podiatry

## 2023-05-28 ENCOUNTER — Ambulatory Visit (INDEPENDENT_AMBULATORY_CARE_PROVIDER_SITE_OTHER): Admitting: Podiatry

## 2023-05-28 DIAGNOSIS — L600 Ingrowing nail: Secondary | ICD-10-CM | POA: Diagnosis not present

## 2023-05-28 MED ORDER — MUPIROCIN 2 % EX OINT: 1.0000 | TOPICAL_OINTMENT | Freq: Two times a day (BID) | CUTANEOUS | 1 refills | Status: AC

## 2023-05-28 MED ORDER — AMOXICILLIN-POT CLAVULANATE 875-125 MG PO TABS: 1.0000 | ORAL_TABLET | Freq: Two times a day (BID) | ORAL | 0 refills | Status: AC

## 2023-05-28 NOTE — Progress Notes (Signed)
   Chief Complaint  Patient presents with   Nail Problem    "He has an ingrown toenail on his left foot." N - ingrown toenail L - 2nd left and 3rd rt D - 1-2 weeks O - suddenly C - red, ingrown A - none T - none    HPI: 33 y.o. male presenting today for evaluation of ingrown toenails to the left second digit and right third digit.  Onset about 1-2 weeks.  He was seen yesterday by Dr. Johnette Naegeli for routine footcare and presents today for urgent work and to have his ingrown toenails evaluated  Past Medical History:  Diagnosis Date   Autism spectrum disorder    DKA (diabetic ketoacidoses) 09/26/2018   Hypothyroidism    Seizure (HCC) 2012   hypoglycemia induced - s/p eval by neuro   Thyroid  nodule    by prior US    Type 1 diabetes (HCC) 2000    Past Surgical History:  Procedure Laterality Date   NO PAST SURGERIES     XI ROBOTIC LAPAROSCOPIC ASSISTED APPENDECTOMY N/A 02/21/2021   Procedure: XI ROBOTIC LAPAROSCOPIC ASSISTED APPENDECTOMY;  Surgeon: Eldred Grego, MD;  Location: [facility] ORS;  Service: General;  Laterality: N/A;    No Known Allergies   Physical Exam: General: The patient is alert and oriented x3 in no acute distress.  Dermatology: Skin is warm, dry and supple bilateral lower extremities.  Ingrowing portion of toenail noted to the left second digit and right third digit.  There is some slight erythema around the area with dried blood.  No purulence today  Vascular: Palpable pedal pulses bilaterally. Capillary refill within normal limits.  No appreciable edema.  No erythema.  Neurological: Grossly intact via light touch  Musculoskeletal Exam: No pedal deformities noted   Assessment/Plan of Care: 1.  Acute ingrown toenail second digit left foot and third digit right foot x 1-2 weeks  -Patient evaluated. -He has never had ingrowns for my knowledge in the past several years of treating him and coming in for routine footcare.  For now we will try to pursue  conservative treatment to see if it resolves on its own before proceeding with any invasive matricectomy procedure -Prescription for Augmentin 875/125 mg x 7 days -Prescription for mupirocin ointment apply daily -Return to clinic 3-4 weeks       Dot Gazella, DPM [facility]  Dr. Dot Gazella, DPM    [address] [place], [place] [address]                Office [phone]  Fax [phone]

## 2023-06-01 NOTE — Progress Notes (Signed)
 ANNUAL DIABETIC FOOT EXAM  Subjective: Francisco Colon presents today for annual diabetic foot exam. Chief Complaint  Patient presents with   Diabetes    "Toenails"  Francisco Silvers, NP - 03/22/2023; A1c - 8.6   Patient confirms h/o diabetes.  Patient denies any h/o foot wounds.  Francisco Anes, MD is patient's PCP.  Past Medical History:  Diagnosis Date   Autism spectrum disorder    DKA (diabetic ketoacidoses) 09/26/2018   Hypothyroidism    Seizure (HCC) 2012   hypoglycemia induced - s/p eval by neuro   Thyroid  nodule    by prior US    Type 1 diabetes (HCC) 2000   Patient Active Problem List   Diagnosis Date Noted   DKA, type 2 (HCC) 09/20/2021   Appendicolith 02/22/2021   Acute appendicitis 02/21/2021   Acute appendicitis with localized peritonitis 02/21/2021   Type 1 diabetes (HCC)    Hypothyroidism (acquired)    Seizure disorder (HCC)    Autism spectrum disorder    DKA, type 1 (HCC) 07/27/2011   Active autistic disorder 04/27/2011   Acquired hypothyroidism 04/27/2011   Seizure (HCC) 04/27/2011   Sickle cell trait (HCC) 04/27/2011   Past Surgical History:  Procedure Laterality Date   NO PAST SURGERIES     XI ROBOTIC LAPAROSCOPIC ASSISTED APPENDECTOMY N/A 02/21/2021   Procedure: XI ROBOTIC LAPAROSCOPIC ASSISTED APPENDECTOMY;  Surgeon: Francisco Grego, MD;  Location: [facility] ORS;  Service: General;  Laterality: N/A;   Current Outpatient Medications on File Prior to Visit  Medication Sig Dispense Refill   levETIRAcetam  (KEPPRA ) 500 MG tablet Take 500 mg by mouth 2 (two) times daily.     levothyroxine  (SYNTHROID ) 112 MCG tablet Take 112-168 mcg by mouth daily before breakfast.     NOVOLOG  MIX 70/30 (70-30) 100 UNIT/ML injection Inject 22-30 Units into the skin See admin instructions. Inject 22u under the skin every morning and inject 30u under the skin every evening before meals     No current facility-administered medications on file prior to visit.    No  Known Allergies Social History   Occupational History   Not on file  Tobacco Use   Smoking status: Never   Smokeless tobacco: Never  Substance and Sexual Activity   Alcohol use: No   Drug use: No   Sexual activity: Not on file   Family History  Problem Relation Age of Onset   Hypertension Maternal Grandmother    Diabetes Brother    Cancer Mother        lung, deceased   Immunization History  Administered Date(s) Administered   DTaP 10/24/1992, 01/09/1993   Hepatitis B 04/14/92, 01/09/1993   IPV 10/24/1992, 01/09/1993   Influenza,inj,Quad PF,6+ Mos 02/23/2014     Review of Systems: Negative except as noted in the HPI.   Objective: There were no vitals filed for this visit.  Francisco Colon is a pleasant 33 y.o. male in NAD. AAO X 3.  Diabetic foot exam was performed with the following findings:   Vascular Examination: Capillary refill time immediate b/l. Vascular status intact b/l with palpable pedal pulses. Pedal hair present b/l. No pain with calf compression b/l. Skin temperature gradient WNL b/l. No cyanosis or clubbing b/l. No ischemia or gangrene noted b/l.   Neurological Examination: Sensation grossly intact b/l with 10 gram monofilament.    Dermatological Examination: Pedal skin with normal turgor, texture and tone b/l.  No open wounds. No interdigital macerations.   Toenails 1-5 b/l thick, discolored, elongated  with subungual debris and pain on dorsal palpation.   Incurvated nailplate lateral border of R 3rd toe and both nail borders of L 2nd toe.  Nail border hypertrophy minimal. There is tenderness to palpation. There is a small amount of granulation tissue.  Musculoskeletal Examination: Muscle strength 5/5 to all lower extremity muscle groups bilaterally. No pain, crepitus or joint limitation noted with ROM bilateral LE. No gross bony deformities bilaterally.  Radiographs: None     Lab Results  Component Value Date   HGBA1C 8.4 (H) 09/20/2021    ADA Risk Categorization: Low Risk :  Patient has all of the following: Intact protective sensation No prior foot ulcer  No severe deformity Pedal pulses present  Assessment: 1. Pain due to onychomycosis of toenails of both feet   2. Ingrown toenail without infection   3. Type 1 diabetes mellitus without complication (HCC)   4. Encounter for diabetic foot exam (HCC)     Plan: -Examined patient. -Patient to continue soft, supportive shoe gear daily. -Toenails were debrided in length and girth bilateral great toes, 3-5 left foot, R 2nd toe, R 4th toe, and R 5th toe with sterile nail nippers and dremel without iatrogenic bleeding.  -No invasive procedure(s) performed. Offending nail border debrided and curretaged lateral border of R 3rd toe and both nail borders of L 2nd toe utilizing sterile nail nipper and currette. Border(s) cleansed with alcohol and triple antibiotic ointment applied. Dispensed written instructions for once daily epsom salt soaks for 7 days. Call office if there are any concerns. -Patient referred to Dr. Wilnette Haste for evaluation of ingrown toenails right 3rd toe and left 2nd toe. -Patient/POA to call should there be question/concern in the interim. Return in about 3 months (around 08/27/2023).  Francisco Colon, DPM      [place] LOCATION: [address] [place] [address]                   Office [phone]   [facility] LOCATION: [address] [place], [place] [address] Office [phone]

## 2023-06-18 ENCOUNTER — Encounter: Payer: Self-pay | Admitting: Podiatry

## 2023-06-18 ENCOUNTER — Ambulatory Visit (INDEPENDENT_AMBULATORY_CARE_PROVIDER_SITE_OTHER): Admitting: Podiatry

## 2023-06-18 VITALS — Ht 67.0 in | Wt 170.0 lb

## 2023-06-18 DIAGNOSIS — L600 Ingrowing nail: Secondary | ICD-10-CM | POA: Diagnosis not present

## 2023-06-18 NOTE — Progress Notes (Signed)
   Chief Complaint  Patient presents with   Ingrown Toenail    Pt is here to f/u on bilateral feet due to having ingrown removed.    HPI: 33 y.o. male presenting today for follow-up evaluation of ingrown toenails to the right foot.  Patient doing better.  No new complaints  Past Medical History:  Diagnosis Date   Autism spectrum disorder    DKA (diabetic ketoacidoses) 09/26/2018   Hypothyroidism    Seizure (HCC) 2012   hypoglycemia induced - s/p eval by neuro   Thyroid  nodule    by prior US    Type 1 diabetes (HCC) 2000    Past Surgical History:  Procedure Laterality Date   NO PAST SURGERIES     XI ROBOTIC LAPAROSCOPIC ASSISTED APPENDECTOMY N/A 02/21/2021   Procedure: XI ROBOTIC LAPAROSCOPIC ASSISTED APPENDECTOMY;  Surgeon: Eldred Grego, MD;  Location: [facility] ORS;  Service: General;  Laterality: N/A;    No Known Allergies   Physical Exam: General: The patient is alert and oriented x3 in no acute distress.  Dermatology: Skin is warm, dry and supple bilateral lower extremities.  The ingrown portion of toenails is resolved.  There is no erythema or drainage or indication of paronychia  Vascular: Palpable pedal pulses bilaterally. Capillary refill within normal limits.  No appreciable edema.  No erythema.  Neurological: Grossly intact via light touch  Musculoskeletal Exam: No pedal deformities noted   Assessment/Plan of Care: 1. Acute ingrown toenail second digit left foot and third digit right foot; resolved  -Patient evaluated -Recommend good supportive shoes that do not irritate or constrict the toebox area -Return to clinic next scheduled appointment with Dr. Johnette Naegeli for routine footcare     Dot Gazella, DPM [facility]  Dr. Dot Gazella, DPM    [address] [place], [place] [address]                Office [phone]  Fax [phone]

## 2023-08-17 IMAGING — CT CT ABD-PELV W/ CM
2 of 4 series · 15 of 46 positions shown, 17 images · IV contrast (agent unspecified)
Comparison: Abdominal radiograph 09/26/2018.

CLINICAL DATA: 30-year-old male with abdominal pain and vomiting
for 2 days.

EXAM:
CT ABDOMEN AND PELVIS WITH CONTRAST
TECHNIQUE: Multidetector CT imaging of the abdomen and pelvis was performed
using the standard protocol following bolus administration of
intravenous contrast.

[Series 2: routine abd/pel with · axial · 0.72mm/px · z∈[-518,-53]mm · 12 of 103 slices shown, 14 images]
[im 5/103  soft-tissue]
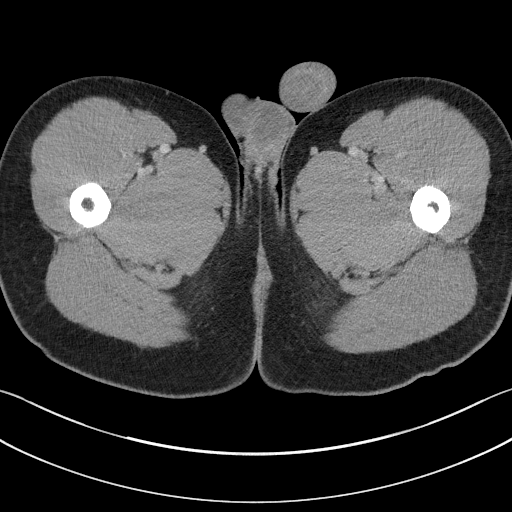
[im 5/103  bone]
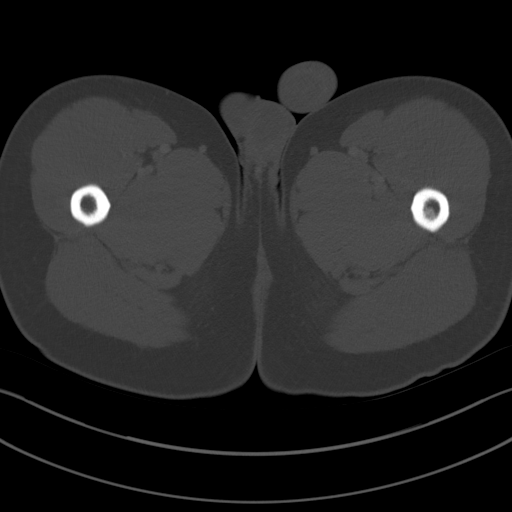
[im 14/103  soft-tissue]
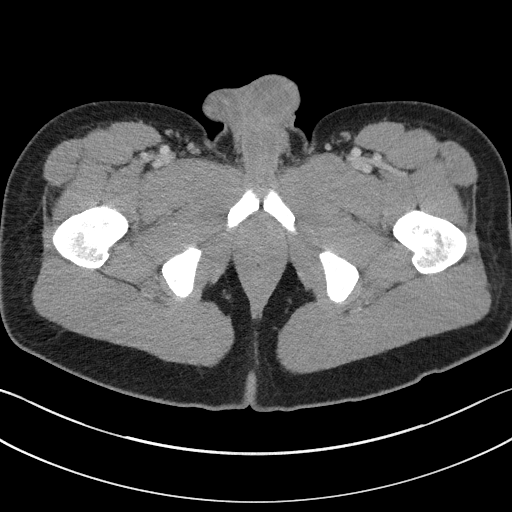
[im 23/103  soft-tissue]
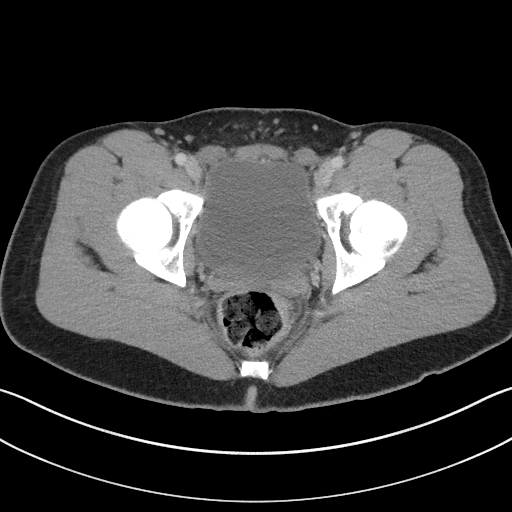
[im 32/103  soft-tissue]
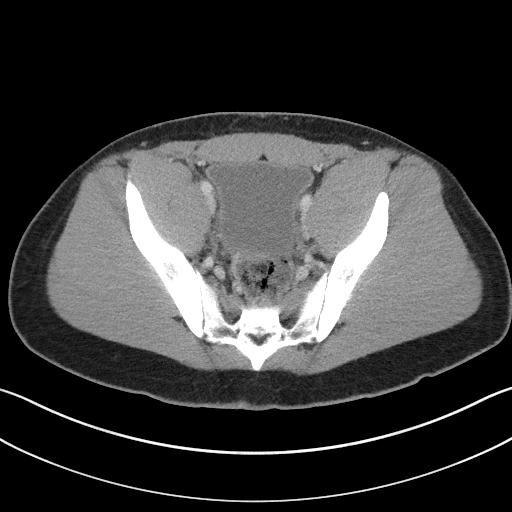
[im 40/103  soft-tissue]
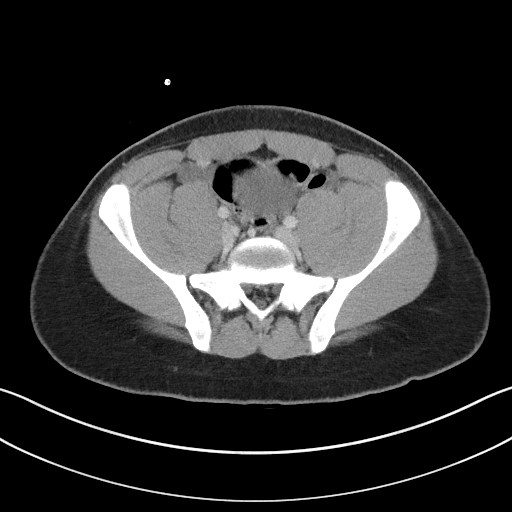
[im 49/103  soft-tissue]
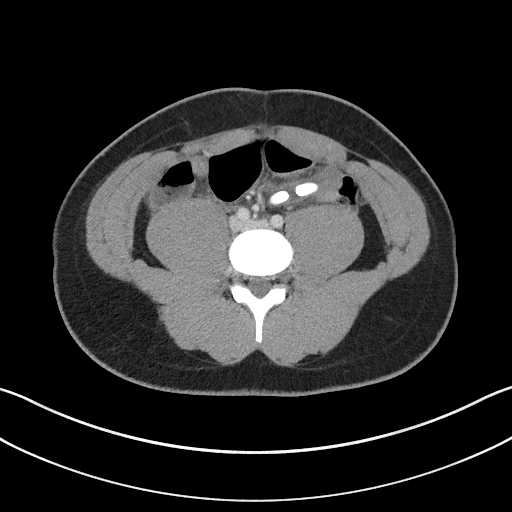
[im 54/103  soft-tissue]
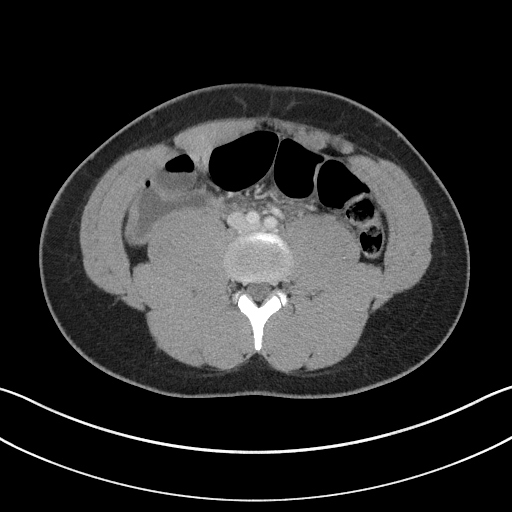
[im 63/103  soft-tissue]
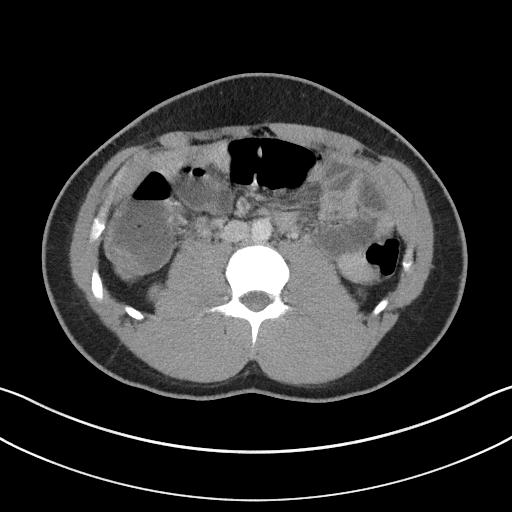
[im 71/103  soft-tissue]
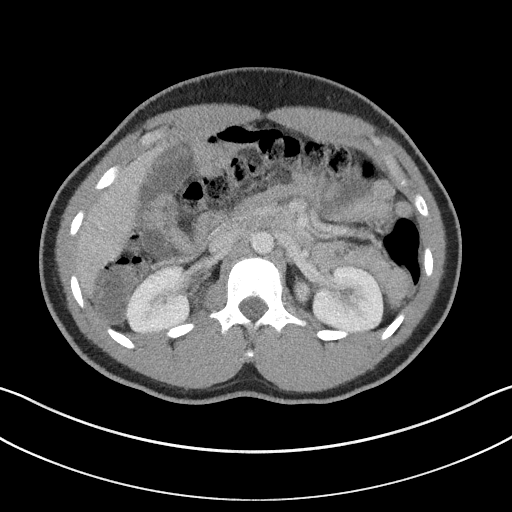
[im 71/103  bone]
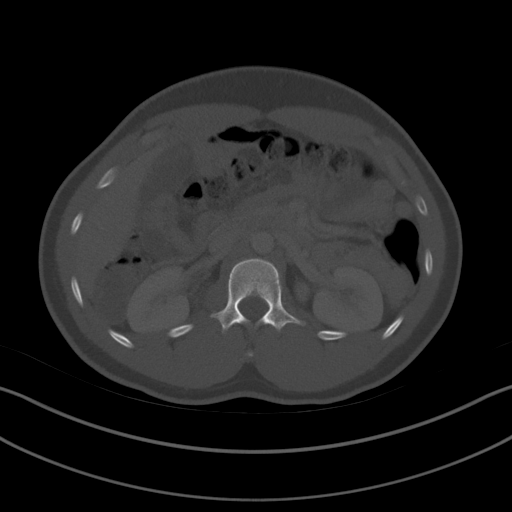
[im 80/103  soft-tissue]
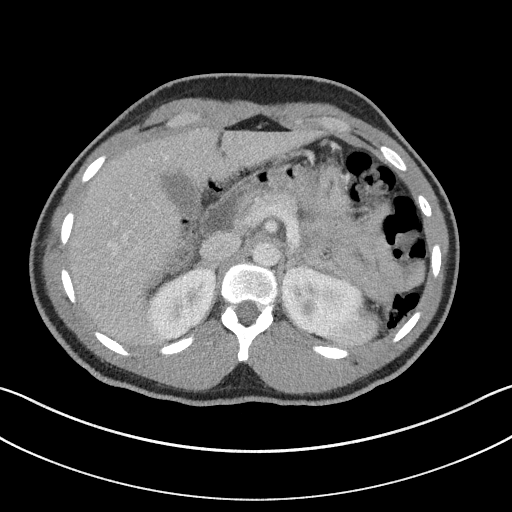
[im 89/103  soft-tissue]
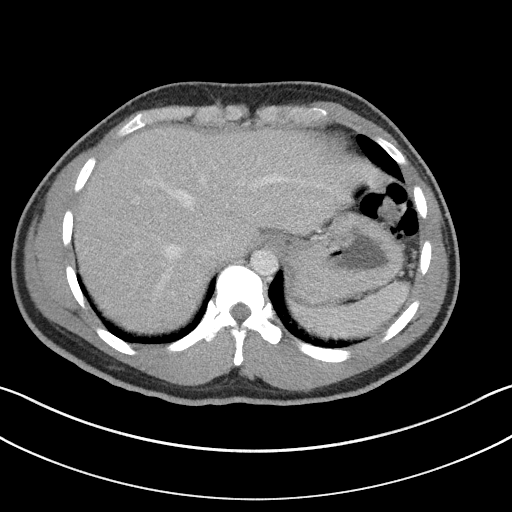
[im 98/103  soft-tissue]
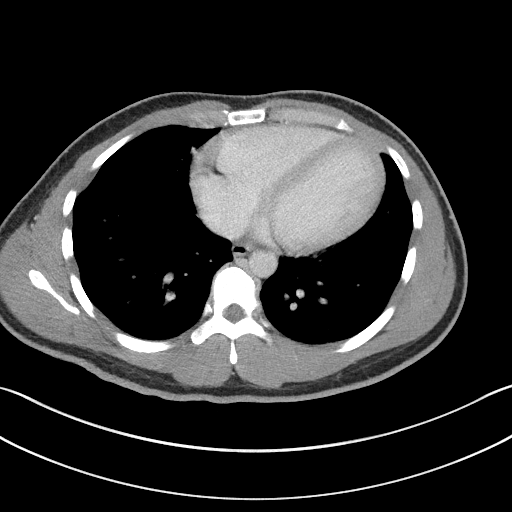

[Series 5: coronal st · coronal · 0.77mm/px · 3 of 80 slices shown]
[im 27/80  soft-tissue]
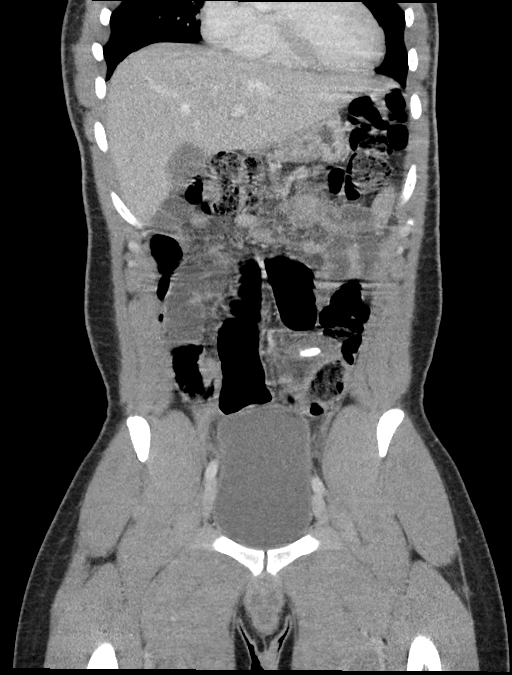
[im 36/80  soft-tissue]
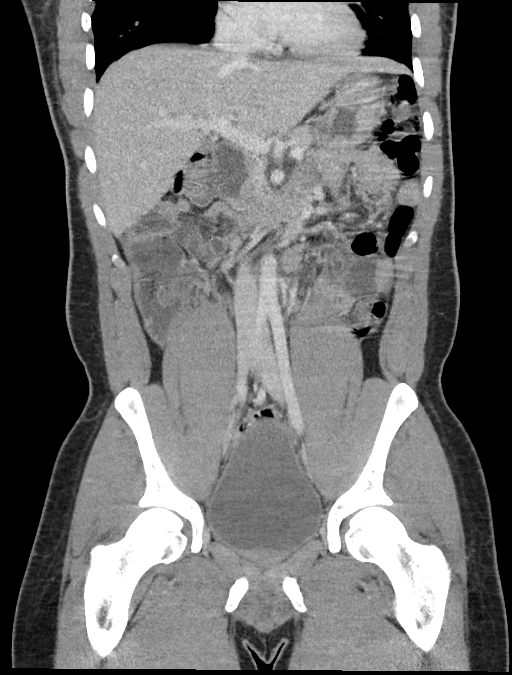
[im 44/80  soft-tissue]
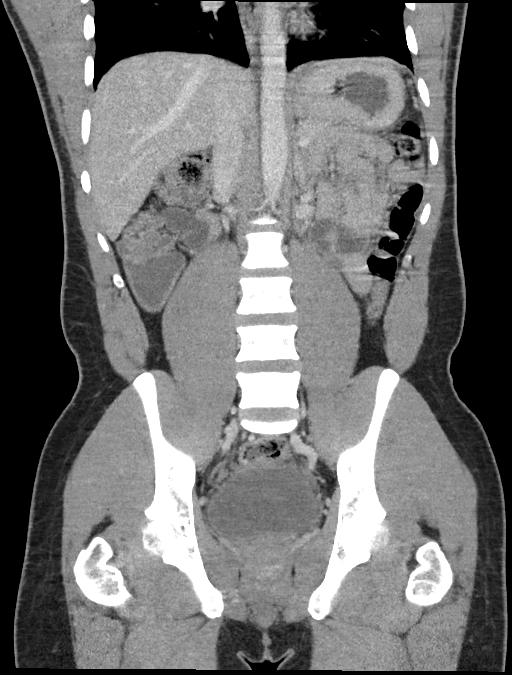

[15 of 46 positions shown; findings below may reference images not displayed]

RADIATION DOSE REDUCTION: This exam was performed according to the
departmental dose-optimization program which includes automated
exposure control, adjustment of the mA and/or kV according to
patient size and/or use of iterative reconstruction technique.

CONTRAST:  100mL OMNIPAQUE IOHEXOL 300 MG/ML  SOLN
FINDINGS: Lower chest: Low lung volumes with mild lung base atelectasis. No
pericardial or pleural effusion.

Hepatobiliary: Negative.

Pancreas: Negative.

Spleen: Negative.

Adrenals/Urinary Tract: Negative.

Stomach/Bowel: Retained stool in the rectum. Redundant sigmoid colon
with more gas than retained stool. Intermittent decompressed
descending colon. Similar retained gas and stool in the transverse
colon. Mostly fluid in the right colon which is nondilated.

Evidence that the appendix arises medially from the cecum on series
2, image 51, and tracks across midline to the left. In the mid and
distal appendix 2 elongated appendicoliths measure up to 15 mm in
length and 7 mm thickness. The tip of the appendix is indistinct and
except for added space the appendix appears mildly inflamed.

Appendix: Location: In the midline and tracking across midline to
the left just below the level of the umbilicus (coronal image 31).

Diameter: Up to 14 mm, abnormal (series 2, image 55).

Appendicolith: Two elongated appendicoliths measure up to 15 mm in
length and 7 mm thickness located in the mid to distal appendix.

Mucosal hyper-enhancement: Minimal

Extraluminal gas: Negative

Periappendiceal collection: Negative.

Negative stomach and duodenum. No dilated small bowel. Fluid-filled
but nondilated terminal ileum and distal small bowel. No free air or
free fluid identified.

Vascular/Lymphatic: Major vascular structures in the abdomen and
pelvis appear to be patent, normal. No lymphadenopathy identified.

Reproductive: Negative.

Other: No pelvic free fluid.

Musculoskeletal: Negative.
IMPRESSION: Positive for Acute Appendicitis.
Note appendix tracks across midline into the LEFT mid abdomen and
contains two sizable appendicoliths.
Dilated and mildly inflamed appendix, with no evidence of
perforation or abscess.

## 2023-09-02 ENCOUNTER — Ambulatory Visit (INDEPENDENT_AMBULATORY_CARE_PROVIDER_SITE_OTHER): Admitting: Podiatry

## 2023-09-02 ENCOUNTER — Encounter: Payer: Self-pay | Admitting: Podiatry

## 2023-09-02 DIAGNOSIS — B351 Tinea unguium: Secondary | ICD-10-CM | POA: Diagnosis not present

## 2023-09-02 DIAGNOSIS — L600 Ingrowing nail: Secondary | ICD-10-CM | POA: Diagnosis not present

## 2023-09-02 DIAGNOSIS — E109 Type 1 diabetes mellitus without complications: Secondary | ICD-10-CM | POA: Diagnosis not present

## 2023-09-02 DIAGNOSIS — M79674 Pain in right toe(s): Secondary | ICD-10-CM | POA: Diagnosis not present

## 2023-09-02 DIAGNOSIS — M79675 Pain in left toe(s): Secondary | ICD-10-CM

## 2023-09-08 ENCOUNTER — Encounter: Payer: Self-pay | Admitting: Podiatry

## 2023-09-08 NOTE — Progress Notes (Signed)
  Subjective:  Patient ID: Francisco Colon, male    DOB: 12/02/1990,  MRN: [id number]  33 y.o. male presents preventative diabetic foot care for painful thick toenails that are difficult to trim. Pain interferes with ambulation. Aggravating factors include wearing enclosed shoe gear. Pain is relieved with periodic professional debridement. He has seen Dr. Janit for ingrown toenails. He is accompanied by his uncle on today's visit. Chief Complaint  Patient presents with   [facility]    Rm3 Diabetic foot care/ Dr. Lamar romans last visit March 2025   New problem(s): None   PCP is romans Lamar, MD.  No Known Allergies  Review of Systems: Negative except as noted in the HPI.   Objective:  Francisco Colon is a pleasant 33 y.o. male WD, WN in NAD. AAO x 3.  Vascular Examination: Vascular status intact b/l with palpable pedal pulses. CFT immediate b/l. Pedal hair present. No edema. No pain with calf compression b/l. Skin temperature gradient WNL b/l. No varicosities noted. No cyanosis or clubbing noted.  Neurological Examination: Sensation grossly intact b/l with 10 gram monofilament. Vibratory sensation intact b/l.  Dermatological Examination: Pedal skin with normal turgor, texture and tone b/l. No open wounds nor interdigital macerations noted. Toenails 1-5 b/l thick, discolored, elongated with subungual debris and pain on dorsal palpation. No hyperkeratotic lesions noted b/l.   Subacute ingrown toenails both borders right 2nd digit and left great toe.  Nail border hypertrophy minimal. There is tenderness to palpation.   Musculoskeletal Examination: Muscle strength 5/5 to b/l LE.  No pain, crepitus noted b/l. No gross pedal deformities. Patient ambulates independently without assistive aids.   Radiographs: None  Assessment:  No diagnosis found.  Plan:  -Patient was evaluated today. All questions/concerns addressed on today's visit. -Guardian present with patient today.  All questions/concerns addressed on today's visit. -Continue foot and shoe inspections daily. Monitor blood glucose per PCP/Endocrinologist's recommendations. -Patient to continue soft, supportive shoe gear daily. -Toenails were debrided in length and girth 2-5 left foot, 3-5 right foot, and right great toe with sterile nail nippers and dremel without iatrogenic bleeding.  -No invasive procedure(s) performed. Offending nail border debrided and curretaged left great toe and R 2nd toe utilizing sterile nail nipper and currette. Border(s) cleansed with alcohol and triple antibiotic ointment applied. Patient/POA/Caregiver/Facility instructed to apply triple antibiotic ointment  to left great toe and right second digit once daily for 7 days. See Dr. Janit if condition does not resolve. Call office if there are any concerns.  Return in about 3 months (around 12/03/2023).  Francisco Colon, DPM      [place] LOCATION: [address] [place] [address]                   Office [phone]   [facility] LOCATION: [address] [place], [place] [address] Office [phone]

## 2023-12-09 ENCOUNTER — Ambulatory Visit (INDEPENDENT_AMBULATORY_CARE_PROVIDER_SITE_OTHER): Admitting: Podiatry

## 2023-12-09 ENCOUNTER — Encounter: Payer: Self-pay | Admitting: Podiatry

## 2023-12-09 DIAGNOSIS — M79675 Pain in left toe(s): Secondary | ICD-10-CM | POA: Diagnosis not present

## 2023-12-09 DIAGNOSIS — E109 Type 1 diabetes mellitus without complications: Secondary | ICD-10-CM | POA: Diagnosis not present

## 2023-12-09 DIAGNOSIS — M79674 Pain in right toe(s): Secondary | ICD-10-CM | POA: Diagnosis not present

## 2023-12-09 DIAGNOSIS — B351 Tinea unguium: Secondary | ICD-10-CM | POA: Diagnosis not present

## 2023-12-15 NOTE — Progress Notes (Signed)
  Subjective:  Patient ID: Francisco Colon, male    DOB: Dec 16, 1990,  MRN: [id number]  33 y.o. male presents to clinic with  preventative diabetic foot care and painful thick toenails that are difficult to trim. Pain interferes with ambulation. Aggravating factors include wearing enclosed shoe gear. Pain is relieved with periodic professional debridement.  Chief Complaint  Patient presents with   Diabetes    A1c 11.3 [facility] is his PCP. Last visit was in Aug.     New problem(s): None   PCP is Francisco Charleston, MD.  No Known Allergies  Review of Systems: Negative except as noted in the HPI.   Objective:  Francisco Colon is a pleasant 33 y.o. male WD, WN in NAD. AAO x 3.  Vascular Examination: Vascular status intact b/l with palpable pedal pulses. Pedal hair sparse. CFT immediate b/l. No edema. No pain with calf compression b/l. Skin temperature gradient WNL b/l.   Neurological Examination: Sensation grossly intact b/l with 10 gram monofilament. Vibratory sensation intact b/l.   Dermatological Examination: Pedal skin with normal turgor, texture and tone b/l. Toenails right great toe, left 2nd toe and 3-5 b/l thick, discolored, elongated with subungual debris and pain on dorsal palpation. No hyperkeratotic lesions noted b/l. Incurvated nailplate medial border of R 2nd toe and lateral border of left great toe.  Nail border hypertrophy absent. There is tenderness to palpation. Sign(s) of infection: no clinical signs of infection noted on examination today..  Musculoskeletal Examination: Muscle strength 5/5 to b/l LE. No pain, crepitus or joint limitation noted with ROM bilateral LE. No gross bony deformities bilaterally.  Radiographs: None Assessment:   1. Pain due to onychomycosis of toenails of both feet   2. Type 1 diabetes mellitus without complications (HCC)    Plan:  -Patient was evaluated today. All questions/concerns addressed on today's visit. -Guardian present  with patient today. All questions/concerns addressed on today's visit. -Patient's uncle informed of findings of subacute ingrown toenails right 2nd toe and left great toe. He will keep an eye on the toes and call if there are any issues. -Mycotic toenails 3-5 bilaterally, L 2nd toe, and right great toe were debrided in length and girth with sterile nail nippers and dremel without iatrogenic bleeding. -No invasive procedure(s) performed. Offending nail border debrided and curretaged left great toe and R 2nd toe utilizing sterile nail nipper and currette. Border cleansed with alcohol and triple antibiotic ointment applied. No further treatment required by patient/caregiver. Call office if there are any concerns. -Patient/POA to call should there be question/concern in the interim.  Return in about 3 months (around 03/10/2024).  Francisco Colon, DPM      [place] LOCATION: [address], [place] [address]                   Office [phone]   [facility] LOCATION: [address] [place], [place] [address] Office [phone]

## 2024-03-19 ENCOUNTER — Ambulatory Visit: Admitting: Podiatry
